# Patient Record
Sex: Male | Born: 1960 | Race: White | Hispanic: No | Marital: Single | Smoking: Current every day smoker
Health system: Southern US, Community
[De-identification: ages and names within clinical notes are randomized; demographics above are authoritative.]

## PROBLEM LIST (undated history)

## (undated) DIAGNOSIS — J45909 Unspecified asthma, uncomplicated: Secondary | ICD-10-CM

---

## 2017-01-06 LAB — CBG MONITORING, ED: Glucose-Capillary: 202 mg/dL — ABNORMAL HIGH (ref 65–99)

## 2017-01-07 ENCOUNTER — Observation Stay (HOSPITAL_COMMUNITY): Payer: Self-pay

## 2017-01-07 ENCOUNTER — Other Ambulatory Visit (HOSPITAL_COMMUNITY): Payer: Self-pay

## 2017-01-07 ENCOUNTER — Emergency Department (HOSPITAL_COMMUNITY): Payer: Self-pay

## 2017-01-07 ENCOUNTER — Encounter (HOSPITAL_COMMUNITY): Payer: Self-pay | Admitting: Radiology

## 2017-01-07 ENCOUNTER — Inpatient Hospital Stay (HOSPITAL_COMMUNITY)
Admission: EM | Admit: 2017-01-07 | Discharge: 2017-01-09 | DRG: 064 | Discharge status: Against Medical Advice | Payer: Self-pay | Attending: Internal Medicine | Admitting: Internal Medicine

## 2017-01-07 DIAGNOSIS — R930 Abnormal findings on diagnostic imaging of skull and head, not elsewhere classified: Secondary | ICD-10-CM

## 2017-01-07 DIAGNOSIS — D72829 Elevated white blood cell count, unspecified: Secondary | ICD-10-CM

## 2017-01-07 DIAGNOSIS — R748 Abnormal levels of other serum enzymes: Secondary | ICD-10-CM | POA: Diagnosis present

## 2017-01-07 DIAGNOSIS — K760 Fatty (change of) liver, not elsewhere classified: Secondary | ICD-10-CM | POA: Diagnosis present

## 2017-01-07 DIAGNOSIS — J45909 Unspecified asthma, uncomplicated: Secondary | ICD-10-CM | POA: Diagnosis present

## 2017-01-07 DIAGNOSIS — Z8673 Personal history of transient ischemic attack (TIA), and cerebral infarction without residual deficits: Secondary | ICD-10-CM | POA: Insufficient documentation

## 2017-01-07 DIAGNOSIS — E872 Acidosis: Secondary | ICD-10-CM | POA: Diagnosis present

## 2017-01-07 DIAGNOSIS — I6340 Cerebral infarction due to embolism of unspecified cerebral artery: Secondary | ICD-10-CM | POA: Insufficient documentation

## 2017-01-07 DIAGNOSIS — G92 Toxic encephalopathy: Secondary | ICD-10-CM | POA: Diagnosis present

## 2017-01-07 DIAGNOSIS — J189 Pneumonia, unspecified organism: Secondary | ICD-10-CM | POA: Diagnosis present

## 2017-01-07 DIAGNOSIS — I639 Cerebral infarction, unspecified: Principal | ICD-10-CM | POA: Diagnosis present

## 2017-01-07 DIAGNOSIS — N179 Acute kidney failure, unspecified: Secondary | ICD-10-CM | POA: Diagnosis present

## 2017-01-07 DIAGNOSIS — F17210 Nicotine dependence, cigarettes, uncomplicated: Secondary | ICD-10-CM | POA: Diagnosis present

## 2017-01-07 DIAGNOSIS — G9340 Encephalopathy, unspecified: Secondary | ICD-10-CM | POA: Diagnosis present

## 2017-01-07 DIAGNOSIS — I248 Other forms of acute ischemic heart disease: Secondary | ICD-10-CM | POA: Diagnosis present

## 2017-01-07 DIAGNOSIS — J181 Lobar pneumonia, unspecified organism: Secondary | ICD-10-CM

## 2017-01-07 DIAGNOSIS — T50901A Poisoning by unspecified drugs, medicaments and biological substances, accidental (unintentional), initial encounter: Secondary | ICD-10-CM | POA: Diagnosis present

## 2017-01-07 DIAGNOSIS — R4182 Altered mental status, unspecified: Secondary | ICD-10-CM

## 2017-01-07 DIAGNOSIS — J690 Pneumonitis due to inhalation of food and vomit: Secondary | ICD-10-CM | POA: Diagnosis present

## 2017-01-07 DIAGNOSIS — R7989 Other specified abnormal findings of blood chemistry: Secondary | ICD-10-CM | POA: Diagnosis present

## 2017-01-07 DIAGNOSIS — R4781 Slurred speech: Secondary | ICD-10-CM | POA: Diagnosis present

## 2017-01-07 HISTORY — DX: Unspecified asthma, uncomplicated: J45.909

## 2017-01-07 LAB — URINALYSIS, ROUTINE W REFLEX MICROSCOPIC
BACTERIA UA: NONE SEEN
BILIRUBIN URINE: NEGATIVE
Bilirubin Urine: NEGATIVE
GLUCOSE, UA: NEGATIVE mg/dL
Glucose, UA: 150 mg/dL — AB
KETONES UR: NEGATIVE mg/dL
KETONES UR: NEGATIVE mg/dL
LEUKOCYTES UA: NEGATIVE
Leukocytes, UA: NEGATIVE
Nitrite: NEGATIVE
Nitrite: NEGATIVE
PROTEIN: 100 mg/dL — AB
PROTEIN: NEGATIVE mg/dL
Specific Gravity, Urine: 1.008 (ref 1.005–1.030)
Specific Gravity, Urine: 1.012 (ref 1.005–1.030)
WBC, UA: NONE SEEN WBC/hpf (ref 0–5)
pH: 5 (ref 5.0–8.0)
pH: 6 (ref 5.0–8.0)

## 2017-01-07 LAB — I-STAT ARTERIAL BLOOD GAS, ED
ACID-BASE DEFICIT: 5 mmol/L — AB (ref 0.0–2.0)
ACID-BASE DEFICIT: 7 mmol/L — AB (ref 0.0–2.0)
Acid-base deficit: 4 mmol/L — ABNORMAL HIGH (ref 0.0–2.0)
BICARBONATE: 20.6 mmol/L (ref 20.0–28.0)
BICARBONATE: 21.8 mmol/L (ref 20.0–28.0)
BICARBONATE: 23.3 mmol/L (ref 20.0–28.0)
O2 SAT: 100 %
O2 SAT: 81 %
O2 Saturation: 87 %
PCO2 ART: 50.3 mmHg — AB (ref 32.0–48.0)
PO2 ART: 271 mmHg — AB (ref 83.0–108.0)
PO2 ART: 52 mmHg — AB (ref 83.0–108.0)
Patient temperature: 98.7
TCO2: 22 mmol/L (ref 0–100)
TCO2: 23 mmol/L (ref 0–100)
TCO2: 25 mmol/L (ref 0–100)
pCO2 arterial: 46.3 mmHg (ref 32.0–48.0)
pCO2 arterial: 47.8 mmHg (ref 32.0–48.0)
pH, Arterial: 7.257 — ABNORMAL LOW (ref 7.350–7.450)
pH, Arterial: 7.267 — ABNORMAL LOW (ref 7.350–7.450)
pH, Arterial: 7.274 — ABNORMAL LOW (ref 7.350–7.450)
pO2, Arterial: 60 mmHg — ABNORMAL LOW (ref 83.0–108.0)

## 2017-01-07 LAB — RAPID URINE DRUG SCREEN, HOSP PERFORMED
AMPHETAMINES: NOT DETECTED
BARBITURATES: NOT DETECTED
BENZODIAZEPINES: NOT DETECTED
COCAINE: NOT DETECTED
Opiates: NOT DETECTED
TETRAHYDROCANNABINOL: NOT DETECTED

## 2017-01-07 LAB — COMPREHENSIVE METABOLIC PANEL
ALBUMIN: 3.8 g/dL (ref 3.5–5.0)
ALK PHOS: 33 U/L — AB (ref 38–126)
ALT: 41 U/L (ref 17–63)
ALT: 49 U/L (ref 17–63)
ANION GAP: 15 (ref 5–15)
AST: 43 U/L — ABNORMAL HIGH (ref 15–41)
AST: 81 U/L — AB (ref 15–41)
Albumin: 4.4 g/dL (ref 3.5–5.0)
Alkaline Phosphatase: 42 U/L (ref 38–126)
Anion gap: 9 (ref 5–15)
BUN: 17 mg/dL (ref 6–20)
BUN: 18 mg/dL (ref 6–20)
CALCIUM: 8 mg/dL — AB (ref 8.9–10.3)
CALCIUM: 9.1 mg/dL (ref 8.9–10.3)
CHLORIDE: 99 mmol/L — AB (ref 101–111)
CO2: 21 mmol/L — AB (ref 22–32)
CO2: 22 mmol/L (ref 22–32)
Chloride: 106 mmol/L (ref 101–111)
Creatinine, Ser: 1.02 mg/dL (ref 0.61–1.24)
Creatinine, Ser: 1.48 mg/dL — ABNORMAL HIGH (ref 0.61–1.24)
GFR calc Af Amer: >60 mL/min (ref >60)
GFR calc non Af Amer: 52 mL/min — ABNORMAL LOW (ref >60)
GFR calc non Af Amer: >60 mL/min (ref >60)
GLUCOSE: 95 mg/dL (ref 65–99)
Glucose, Bld: 200 mg/dL — ABNORMAL HIGH (ref 65–99)
Potassium: 4.4 mmol/L (ref 3.5–5.1)
Potassium: 4.5 mmol/L (ref 3.5–5.1)
SODIUM: 135 mmol/L (ref 135–145)
SODIUM: 137 mmol/L (ref 135–145)
Total Bilirubin: 0.3 mg/dL (ref 0.3–1.2)
Total Bilirubin: 0.7 mg/dL (ref 0.3–1.2)
Total Protein: 6.6 g/dL (ref 6.5–8.1)
Total Protein: 7.7 g/dL (ref 6.5–8.1)

## 2017-01-07 LAB — CBG MONITORING, ED
Glucose-Capillary: 119 mg/dL — ABNORMAL HIGH (ref 65–99)
Glucose-Capillary: 100 mg/dL — ABNORMAL HIGH (ref 65–99)

## 2017-01-07 LAB — CBC WITH DIFFERENTIAL/PLATELET
BASOS PCT: 0 %
Basophils Absolute: 0 10*3/uL (ref 0.0–0.1)
Basophils Absolute: 0 10*3/uL (ref 0.0–0.1)
Basophils Relative: 0 %
EOS PCT: 0 %
Eosinophils Absolute: 0 10*3/uL (ref 0.0–0.7)
Eosinophils Absolute: 0 10*3/uL (ref 0.0–0.7)
Eosinophils Relative: 0 %
HCT: 43.2 % (ref 39.0–52.0)
HCT: 45.9 % (ref 39.0–52.0)
HEMOGLOBIN: 14.3 g/dL (ref 13.0–17.0)
HEMOGLOBIN: 15.3 g/dL (ref 13.0–17.0)
LYMPHS ABS: 0.9 10*3/uL (ref 0.7–4.0)
LYMPHS ABS: 1.1 10*3/uL (ref 0.7–4.0)
LYMPHS PCT: 7 %
Lymphocytes Relative: 4 %
MCH: 30 pg (ref 26.0–34.0)
MCH: 30.5 pg (ref 26.0–34.0)
MCHC: 33.1 g/dL (ref 30.0–36.0)
MCHC: 33.3 g/dL (ref 30.0–36.0)
MCV: 90.8 fL (ref 78.0–100.0)
MCV: 91.6 fL (ref 78.0–100.0)
MONOS PCT: 10 %
Monocytes Absolute: 0.5 10*3/uL (ref 0.1–1.0)
Monocytes Absolute: 2.3 10*3/uL — ABNORMAL HIGH (ref 0.1–1.0)
Monocytes Relative: 3 %
NEUTROS ABS: 19.7 10*3/uL — AB (ref 1.7–7.7)
Neutro Abs: 13.7 10*3/uL — ABNORMAL HIGH (ref 1.7–7.7)
Neutrophils Relative %: 86 %
Neutrophils Relative %: 90 %
PLATELETS: 333 10*3/uL (ref 150–400)
Platelets: 333 10*3/uL (ref 150–400)
RBC: 4.76 MIL/uL (ref 4.22–5.81)
RBC: 5.01 MIL/uL (ref 4.22–5.81)
RDW: 12.9 % (ref 11.5–15.5)
RDW: 13.2 % (ref 11.5–15.5)
WBC: 15.3 10*3/uL — AB (ref 4.0–10.5)
WBC: 23 10*3/uL — ABNORMAL HIGH (ref 4.0–10.5)

## 2017-01-07 LAB — BASIC METABOLIC PANEL
Anion gap: 11 (ref 5–15)
Anion gap: 9 (ref 5–15)
BUN: 18 mg/dL (ref 6–20)
BUN: 19 mg/dL (ref 6–20)
CALCIUM: 8.2 mg/dL — AB (ref 8.9–10.3)
CHLORIDE: 104 mmol/L (ref 101–111)
CO2: 22 mmol/L (ref 22–32)
CO2: 23 mmol/L (ref 22–32)
CREATININE: 1.36 mg/dL — AB (ref 0.61–1.24)
CREATININE: 1.19 mg/dL (ref 0.61–1.24)
Calcium: 8.1 mg/dL — ABNORMAL LOW (ref 8.9–10.3)
Chloride: 106 mmol/L (ref 101–111)
GFR calc Af Amer: >60 mL/min (ref >60)
GFR calc Af Amer: >60 mL/min (ref >60)
GFR calc non Af Amer: 58 mL/min — ABNORMAL LOW (ref >60)
GFR calc non Af Amer: >60 mL/min (ref >60)
GLUCOSE: 110 mg/dL — AB (ref 65–99)
GLUCOSE: 115 mg/dL — AB (ref 65–99)
POTASSIUM: 4.9 mmol/L (ref 3.5–5.1)
Potassium: 4.9 mmol/L (ref 3.5–5.1)
SODIUM: 138 mmol/L (ref 135–145)
Sodium: 137 mmol/L (ref 135–145)

## 2017-01-07 LAB — I-STAT CHEM 8, ED
BUN: 20 mg/dL (ref 6–20)
CALCIUM ION: 1.11 mmol/L — AB (ref 1.15–1.40)
Chloride: 101 mmol/L (ref 101–111)
Creatinine, Ser: 1.6 mg/dL — ABNORMAL HIGH (ref 0.61–1.24)
GLUCOSE: 202 mg/dL — AB (ref 65–99)
HCT: 47 % (ref 39.0–52.0)
Hemoglobin: 16 g/dL (ref 13.0–17.0)
Potassium: 4.5 mmol/L (ref 3.5–5.1)
Sodium: 136 mmol/L (ref 135–145)
TCO2: 26 mmol/L (ref 0–100)

## 2017-01-07 LAB — HEPATIC FUNCTION PANEL
ALBUMIN: 4.2 g/dL (ref 3.5–5.0)
ALK PHOS: 35 U/L — AB (ref 38–126)
ALT: 47 U/L (ref 17–63)
AST: 57 U/L — AB (ref 15–41)
BILIRUBIN TOTAL: 1 mg/dL (ref 0.3–1.2)
Bilirubin, Direct: 0.3 mg/dL (ref 0.1–0.5)
Indirect Bilirubin: 0.7 mg/dL (ref 0.3–0.9)
Total Protein: 7.2 g/dL (ref 6.5–8.1)

## 2017-01-07 LAB — SODIUM, URINE, RANDOM: Sodium, Ur: 124 mmol/L

## 2017-01-07 LAB — BRAIN NATRIURETIC PEPTIDE: B NATRIURETIC PEPTIDE 5: 69.7 pg/mL (ref 0.0–100.0)

## 2017-01-07 LAB — CREATININE, URINE, RANDOM: CREATININE, URINE: 62.33 mg/dL

## 2017-01-07 LAB — MISCELLANEOUS TEST

## 2017-01-07 LAB — ETHANOL: Alcohol, Ethyl (B): <5 mg/dL (ref <5)

## 2017-01-07 LAB — TROPONIN I
TROPONIN I: 0.29 ng/mL — AB (ref <0.03)
TROPONIN I: 0.43 ng/mL — AB (ref <0.03)
Troponin I: 0.3 ng/mL (ref <0.03)
Troponin I: 0.34 ng/mL (ref <0.03)

## 2017-01-07 LAB — HIV ANTIBODY (ROUTINE TESTING W REFLEX): HIV Screen 4th Generation wRfx: NONREACTIVE

## 2017-01-07 LAB — I-STAT CG4 LACTIC ACID, ED: LACTIC ACID, VENOUS: 1.65 mmol/L (ref 0.5–1.9)

## 2017-01-07 LAB — SALICYLATE LEVEL: Salicylate Lvl: <7 mg/dL (ref 2.8–30.0)

## 2017-01-07 LAB — AMMONIA: Ammonia: 45 umol/L — ABNORMAL HIGH (ref 9–35)

## 2017-01-07 LAB — CK: Total CK: 546 U/L — ABNORMAL HIGH (ref 49–397)

## 2017-01-07 LAB — STREP PNEUMONIAE URINARY ANTIGEN: Strep Pneumo Urinary Antigen: NEGATIVE

## 2017-01-07 LAB — ACETAMINOPHEN LEVEL

## 2017-01-07 MED ORDER — ASPIRIN 81 MG PO CHEW
81.0000 mg | CHEWABLE_TABLET | Freq: Every day | ORAL | Status: DC
  Administered 2017-01-08: 81 mg via ORAL
  Filled 2017-01-07: qty 1

## 2017-01-07 MED ORDER — NALOXONE HCL 0.4 MG/ML IJ SOLN
0.4000 mg | Freq: Once | INTRAMUSCULAR | Status: AC
  Administered 2017-01-07: 0.4 mg via INTRAVENOUS
  Filled 2017-01-07: qty 1

## 2017-01-07 MED ORDER — DEXTROSE 5 % IV SOLN
1.0000 g | INTRAVENOUS | Status: DC
  Administered 2017-01-07 – 2017-01-09 (×3): 1 g via INTRAVENOUS
  Filled 2017-01-07 (×3): qty 10

## 2017-01-07 MED ORDER — ONDANSETRON HCL 4 MG/2ML IJ SOLN: 4.0000 mg | Freq: Four times a day (QID) | INTRAMUSCULAR | Status: DC | PRN

## 2017-01-07 MED ORDER — VANCOMYCIN HCL IN DEXTROSE 1-5 GM/200ML-% IV SOLN
1000.0000 mg | Freq: Once | INTRAVENOUS | Status: AC
  Administered 2017-01-07: 1000 mg via INTRAVENOUS
  Filled 2017-01-07: qty 200

## 2017-01-07 MED ORDER — PIPERACILLIN-TAZOBACTAM 3.375 G IVPB
3.3750 g | Freq: Once | INTRAVENOUS | Status: AC
  Administered 2017-01-07: 3.375 g via INTRAVENOUS
  Filled 2017-01-07: qty 50

## 2017-01-07 MED ORDER — LORAZEPAM 2 MG/ML IJ SOLN
1.0000 mg | Freq: Once | INTRAMUSCULAR | Status: AC
  Administered 2017-01-07: 1 mg via INTRAVENOUS
  Filled 2017-01-07: qty 1

## 2017-01-07 MED ORDER — SODIUM CHLORIDE 0.9 % IV BOLUS (SEPSIS)
1000.0000 mL | Freq: Once | INTRAVENOUS | Status: AC
Start: 2017-01-07 — End: 2017-01-07
  Administered 2017-01-07: 1000 mL via INTRAVENOUS

## 2017-01-07 MED ORDER — ACETAMINOPHEN 325 MG PO TABS: 650.0000 mg | ORAL_TABLET | Freq: Four times a day (QID) | ORAL | Status: DC | PRN

## 2017-01-07 MED ORDER — DEXTROSE 5 % IV SOLN
500.0000 mg | INTRAVENOUS | Status: DC
  Administered 2017-01-07 – 2017-01-09 (×3): 500 mg via INTRAVENOUS
  Filled 2017-01-07 (×3): qty 500

## 2017-01-07 MED ORDER — ALBUTEROL SULFATE (2.5 MG/3ML) 0.083% IN NEBU
2.5000 mg | INHALATION_SOLUTION | RESPIRATORY_TRACT | Status: DC | PRN
  Administered 2017-01-07: 2.5 mg via RESPIRATORY_TRACT
  Filled 2017-01-07: qty 3

## 2017-01-07 MED ORDER — ACETAMINOPHEN 650 MG RE SUPP: 650.0000 mg | Freq: Four times a day (QID) | RECTAL | Status: DC | PRN

## 2017-01-07 MED ORDER — SODIUM CHLORIDE 0.9 % IV SOLN
INTRAVENOUS | Status: AC
  Administered 2017-01-07: 08:00:00 via INTRAVENOUS

## 2017-01-07 MED ORDER — HEPARIN SODIUM (PORCINE) 5000 UNIT/ML IJ SOLN
5000.0000 [IU] | Freq: Three times a day (TID) | INTRAMUSCULAR | Status: DC
  Administered 2017-01-07 – 2017-01-09 (×4): 5000 [IU] via SUBCUTANEOUS
  Filled 2017-01-07 (×4): qty 1

## 2017-01-07 MED ORDER — SODIUM CHLORIDE 0.9 % IV BOLUS (SEPSIS)
1000.0000 mL | Freq: Once | INTRAVENOUS | Status: AC
  Administered 2017-01-07: 1000 mL via INTRAVENOUS

## 2017-01-07 MED ORDER — LACTULOSE 10 GM/15ML PO SOLN
10.0000 g | Freq: Three times a day (TID) | ORAL | Status: DC
  Administered 2017-01-07 – 2017-01-08 (×3): 10 g via ORAL
  Filled 2017-01-07 (×7): qty 15

## 2017-01-07 MED ORDER — ONDANSETRON HCL 4 MG PO TABS: 4.0000 mg | ORAL_TABLET | Freq: Four times a day (QID) | ORAL | Status: DC | PRN

## 2017-01-07 MED ORDER — SODIUM CHLORIDE 0.9 % IV SOLN
INTRAVENOUS | Status: DC
  Administered 2017-01-07: 03:00:00 via INTRAVENOUS

## 2017-01-07 NOTE — ED Notes (Signed)
Singh, MD at bedside.

## 2017-01-07 NOTE — ED Notes (Signed)
[facility] called and states they are unfamiliar with the panel needed

## 2017-01-07 NOTE — ED Notes (Signed)
Main lab called again with lab code and name. Main lab to help order the lab

## 2017-01-07 NOTE — Procedures (Signed)
ELECTROENCEPHALOGRAM REPORT  Date of Study: 01/07/2017  Patient's Name: Darren Lane MRN: [id number] Date of Birth: [birth year]  Referring Provider: Dr. Midge MiniumArshad Kakrakandy  Clinical History: This is a 56 year old man with altered mental status.   Medications: acetaminophen (TYLENOL) tablet 650 mg  aspirin chewable tablet 81 mg  azithromycin (ZITHROMAX) 500 mg in dextrose 5 % 250 mL IVPB  cefTRIAXone (ROCEPHIN) 1 g in dextrose 5 % 50 mL IVPB   Technical Summary: A multichannel digital EEG recording measured by the international 10-20 system with electrodes applied with paste and impedances below 5000 ohms performed as portable with EKG monitoring in a predominantly drowsy and asleep patient.  Hyperventilation and photic stimulation were not performed.  The digital EEG was referentially recorded, reformatted, and digitally filtered in a variety of bipolar and referential montages for optimal display.   Description: The patient is predominantly drowsy and asleep during the recording.  During brief period of wakefulness, there is a symmetric, medium voltage 8 Hz posterior dominant rhythm that poorly attenuates with eye opening and eye closure. This is admixed with a small amount of diffuse 4-5 Hz theta and 2-3 Hz delta slowing of the waking background.  During drowsiness and sleep, there is an increase in theta and delta slowing of the background, at times with triphasic-like appearance. Occasional vertex waves were seen. Hyperventilation and photic stimulation were not performed.  There were no epileptiform discharges or electrographic seizures seen.    EKG lead showed sinus tachycardia.  Impression: This predominantly drowsy and asleep EEG is abnormal due to mild to moderate diffuse slowing of the background, at times with triphasic-like appearance.  Clinical Correlation of the above findings indicates diffuse cerebral dysfunction that is non-specific in etiology and can be seen with  hypoxic/ischemic injury, toxic/metabolic encephalopathies, or medication effect. Triphasic-like waves are typically seen with hepatic encephalopathy, but can be seen with other metabolic encephalopathies as well. The absence of epileptiform discharges does not rule out a clinical diagnosis of epilepsy.  Clinical correlation is advised.   Patrcia DollyKaren Aquino, M.D.

## 2017-01-07 NOTE — Consult Note (Signed)
Requesting Physician: Dr. Thedore Mins    Chief Complaint: confusion with punctate CVA found on MRI  History obtained from:  Darren Lane     HPI:                                                                                                                                         Darren Lane is an 56 y.o. male history of asthma was brought to the ER after Darren Lane was found to be unresponsive by Darren Lane's friend last night around 11 PM. As per the Darren Lane's friend Darren Lane was doing fine when she left home at around 3 PM. When she came back home Darren Lane was lying on the bed unresponsive with labored breathing. EMS was called and Darren Lane was given Narcan following which Darren Lane's breathing improved. In the ER Darren Lane was found to have pinpoint pupils. CT head was done which was showing abnormality concerning for stroke and neurologist on call as we initially consulted. Darren Lane was given another dose of Narcan in the ER and became alert and awake. Lab work show leukocytosis and anion gap metabolic acidosis and renal failure. Darren Lane states did use antifreeze yesterday to fill the car but denies drinking any antifreeze. Darren Lane drinks alcohol only very occasionally. Denies using drugs. Urine drug screen came negative. Blood alcohol levels including methanol and ethylene glycol levels have been sent. On exam Darren Lane is alert awake oriented to time place and person but occasionally dozes off.  During neurological consultation Darren Lane states that he did take Xanax however this was not found in his drug screen. During the consultation Darren Lane with commonly not often fall asleep but then quickly wake up when stimulated. Darren Lane states that he's been under a lot of stress lately and has not been sleeping. Discussing with the wife was at bedside he does get approximate 7-8 hours sleep at night. Darren Lane is very lethargic and has slurred speech. He is perseverating on being stressed out and working 2 jobs. Darren Lane appears flushed  at this point in time but is able to follow commands  ED Course: Chest Xray shows possible infiltrates and pleural effusion. Blood work was showing leukocytosis and anion gap metabolic acidosis with renal failure. CT head was showing abnormalities concerning for possible stroke. Blood methanol level, ethylene glycol and isopropyl alcohol levels have been sent. Troponin came mildly elevated.  Date last known well: Unable to determine Time last known well: Unable to determine tPA Given: No: minimal symptoms   Past Medical History:  Diagnosis Date  . Asthma     History reviewed. No pertinent surgical history.  Family History  Problem Relation Age of Onset  . Asthma Neg Hx   . Diabetes Mellitus II Neg Hx    Social History:  reports that he has never smoked. He has never used smokeless tobacco. He reports that he drinks alcohol. His drug history is not on file.  Allergies:  No Known Allergies  Medications:                                                                                                                          Current Facility-Administered Medications  Medication Dose Route Frequency Provider Last Rate Last Dose  . 0.9 %  sodium chloride infusion   Intravenous Continuous Eduard ClosArshad N Kakrakandy, MD 125 mL/hr at 01/07/17 0753    . acetaminophen (TYLENOL) tablet 650 mg  650 mg Oral Q6H PRN Eduard ClosArshad N Kakrakandy, MD       Or  . acetaminophen (TYLENOL) suppository 650 mg  650 mg Rectal Q6H PRN Eduard ClosArshad N Kakrakandy, MD      . aspirin chewable tablet 81 mg  81 mg Oral Daily Leroy SeaPrashant K Singh, MD      . azithromycin (ZITHROMAX) 500 mg in dextrose 5 % 250 mL IVPB  500 mg Intravenous Q24H Eduard ClosArshad N Kakrakandy, MD 250 mL/hr at 01/07/17 0857 500 mg at 01/07/17 0857  . cefTRIAXone (ROCEPHIN) 1 g in dextrose 5 % 50 mL IVPB  1 g Intravenous Q24H Stevphen RochesterJames L Ledford, RPH   Stopped at 01/07/17 [id number]  . ondansetron (ZOFRAN) tablet 4 mg  4 mg Oral Q6H PRN Eduard ClosArshad N Kakrakandy, MD       Or  .  ondansetron [facility](ZOFRAN) injection 4 mg  4 mg Intravenous Q6H PRN Eduard ClosArshad N Kakrakandy, MD       No current outpatient prescriptions on file.     ROS:                                                                                                                                       History obtained from the Darren Lane  General ROS: negative for - chills, fatigue, fever, night sweats, weight gain or weight loss Psychological ROS: negative for - behavioral disorder, hallucinations, memory difficulties, mood swings or suicidal ideation Ophthalmic ROS: negative for - blurry vision, double vision, eye pain or loss of vision ENT ROS: negative for - epistaxis, nasal discharge, oral lesions, sore throat, tinnitus or vertigo Allergy and Immunology ROS: negative for - hives or itchy/watery eyes Hematological and Lymphatic ROS: negative for - bleeding problems, bruising or swollen lymph nodes Endocrine ROS: negative for - galactorrhea, hair pattern changes, polydipsia/polyuria or temperature intolerance Respiratory ROS: negative for - cough, hemoptysis, shortness of breath or wheezing Cardiovascular ROS: negative for -  chest pain, dyspnea on exertion, edema or irregular heartbeat Gastrointestinal ROS: negative for - abdominal pain, diarrhea, hematemesis, nausea/vomiting or stool incontinence Genito-Urinary ROS: negative for - dysuria, hematuria, incontinence or urinary frequency/urgency Musculoskeletal ROS: negative for - joint swelling or muscular weakness Neurological ROS: as noted in HPI Dermatological ROS: negative for rash and skin lesion changes  Neurologic Examination:                                                                                                      Blood pressure 116/76, pulse 120, temperature 99.9 F (37.7 C), temperature source Rectal, resp. rate 10, SpO2 98 %.  HEENT-  Normocephalic, no lesions, without obvious abnormality.  Normal external eye and conjunctiva.  Normal TM's  bilaterally.  Normal auditory canals and external ears. Normal external nose, mucus membranes and septum.  Normal pharynx. Cardiovascular- S1, S2 normal, pulses palpable throughout   Lungs- chest clear, no wheezing, rales, normal symmetric air entry Abdomen- normal findings: bowel sounds normal Extremities- no edema Lymph-no adenopathy palpable Musculoskeletal-no joint tenderness, deformity or swelling Skin-warm and dry, no hyperpigmentation, vitiligo, or suspicious lesions  Neurological Examination Mental Status: Darren Lane is very drowsy and will continually follow sleep is not continuously stimulated either verbally or by tactile stimuli. Eyes will slowly closed and head will fall backwards. Oriented-but is not a very good historian.  Speech dysarthric without evidence of aphasia.  Able to follow 3 step commands without difficulty. Darren Lane became slightly angry when told that he was going to stay at the hospital, as we have no good etiology to understand why he is so drowsy and continues to fall asleep. Cranial Nerves: II: ; Visual fields grossly normal,  III,IV, VI: ptosis not present, extra-ocular motions intact bilaterally, pupils equal 1 mm equally, round, reactive to light and accommodation V,VII: smile symmetric, facial light touch sensation normal bilaterally VIII: hearing normal bilaterally IX,X: uvula rises symmetrically XI: bilateral shoulder shrug XII: midline tongue extension Motor: Right : Upper extremity   5/5    Left:     Upper extremity   5/5  Lower extremity   5/5     Lower extremity   5/5 Tone and bulk:normal tone throughout; no atrophy noted Sensory: Pinprick and light touch intact throughout, bilaterally Deep Tendon Reflexes: 2+ and symmetric throughout Plantars: Right: downgoing   Left: downgoing Cerebellar: Darren Lane had notable tremor all doing finger-to-nose but no dysmetria. Darren Lane was able to do heel-to-shin without any difficulty. Gait: Not tested secondary to  Darren Lane safety as he continuously would fall asleep.       Lab Results: Basic Metabolic Panel:  Recent Labs Lab 01/07/17 0004 01/07/17 0013 01/07/17 0556 01/07/17 0804  NA 135 136 137 138  K 4.5 4.5 4.9 4.9  CL 99* 101 104 106  CO2 21*  --  22 23  GLUCOSE 200* 202* 110* 115*  BUN 17 20 18 19   CREATININE 1.48* 1.60* 1.36* 1.19  CALCIUM 9.1  --  8.1* 8.2*    Liver Function Tests:  Recent Labs Lab 01/07/17 0004 01/07/17 0556  AST 43* 57*  ALT 41 47  ALKPHOS 42 35*  BILITOT 0.3 1.0  PROT 7.7 7.2  ALBUMIN 4.4 4.2   No results for input(s): LIPASE, AMYLASE in the last 168 hours.  Recent Labs Lab 01/07/17 0547  AMMONIA 45*    CBC:  Recent Labs Lab 01/07/17 0004 01/07/17 0013 01/07/17 0556  WBC 23.0*  --  15.3*  NEUTROABS 19.7*  --  13.7*  HGB 15.3 16.0 14.3  HCT 45.9 47.0 43.2  MCV 91.6  --  90.8  PLT 333  --  333    Cardiac Enzymes:  Recent Labs Lab 01/07/17 0149 01/07/17 0804  CKTOTAL 546*  --   TROPONINI 0.29* 0.43*    Lipid Panel: No results for input(s): CHOL, TRIG, HDL, CHOLHDL, VLDL, LDLCALC in the last 168 hours.  CBG:  Recent Labs Lab 01/07/17 0003 01/07/17 0739  GLUCAP 202* 119*    Microbiology: No results found for this or any previous visit.  Coagulation Studies: No results for input(s): LABPROT, INR in the last 72 hours.  Imaging: Ct Head Wo Contrast  Result Date: 01/07/2017 CLINICAL DATA:  56 y/o M; altered mental status following possible overdose. All EXAM: CT HEAD WITHOUT CONTRAST CT CERVICAL SPINE WITHOUT CONTRAST TECHNIQUE: Multidetector CT imaging of the head and cervical spine was performed following the standard protocol without intravenous contrast. Multiplanar CT image reconstructions of the cervical spine were also generated. COMPARISON:  None. FINDINGS: CT HEAD FINDINGS Brain: No evidence of acute infarction, hemorrhage, hydrocephalus, extra-axial collection or mass lesion/mass effect. Small lucencies in  left lentiform nucleus at insula may represent prominent perivascular spaces or age-indeterminate lacunar infarcts. Vascular: No hyperdense vessel or unexpected calcification. Skull: Normal. Negative for fracture or focal lesion. Sinuses/Orbits: Partial opacification of paranasal sinuses with aerosolized secretions and fluid levels. Normally pneumatized mastoid air cells. Orbits are unremarkable. Other: Left-sided nasal airway to urinating in the hypopharynx. CT CERVICAL SPINE FINDINGS Alignment: Straightening of cervical lordosis without listhesis or dislocation. Skull base and vertebrae: No acute fracture. No primary bone lesion or focal pathologic process. Soft tissues and spinal canal: No prevertebral fluid or swelling. No visible canal hematoma. Disc levels: Mild cervical spondylosis with minimal disc space narrowing and marginal osteophytosis at the C5 through C7 levels. Right-sided upper cervical facet degenerative changes. No high-grade bony canal stenosis. Upper chest: Emphysema of lung apices. Other: Debris within the oral and hypopharynx. IMPRESSION: 1. No acute intracranial abnormality identified. 2. Small lucencies in left lentiform nucleus and insula may represent prominent perivascular spaces or age-indeterminate lacunar infarcts. 3. Opacification of paranasal sinuses with fluid levels possibly due to the presence of nasal tube. 4. No acute fracture or dislocation of cervical spine. 5. Lung apex emphysema. Electronically Signed   By: Mitzi Hansen M.D.   On: 01/07/2017 01:14   Ct Cervical Spine Wo Contrast  Result Date: 01/07/2017 CLINICAL DATA:  56 y/o M; altered mental status following possible overdose. All EXAM: CT HEAD WITHOUT CONTRAST CT CERVICAL SPINE WITHOUT CONTRAST TECHNIQUE: Multidetector CT imaging of the head and cervical spine was performed following the standard protocol without intravenous contrast. Multiplanar CT image reconstructions of the cervical spine were also  generated. COMPARISON:  None. FINDINGS: CT HEAD FINDINGS Brain: No evidence of acute infarction, hemorrhage, hydrocephalus, extra-axial collection or mass lesion/mass effect. Small lucencies in left lentiform nucleus at insula may represent prominent perivascular spaces or age-indeterminate lacunar infarcts. Vascular: No hyperdense vessel or unexpected calcification. Skull: Normal. Negative for fracture or focal lesion. Sinuses/Orbits: Partial opacification of paranasal sinuses  with aerosolized secretions and fluid levels. Normally pneumatized mastoid air cells. Orbits are unremarkable. Other: Left-sided nasal airway to urinating in the hypopharynx. CT CERVICAL SPINE FINDINGS Alignment: Straightening of cervical lordosis without listhesis or dislocation. Skull base and vertebrae: No acute fracture. No primary bone lesion or focal pathologic process. Soft tissues and spinal canal: No prevertebral fluid or swelling. No visible canal hematoma. Disc levels: Mild cervical spondylosis with minimal disc space narrowing and marginal osteophytosis at the C5 through C7 levels. Right-sided upper cervical facet degenerative changes. No high-grade bony canal stenosis. Upper chest: Emphysema of lung apices. Other: Debris within the oral and hypopharynx. IMPRESSION: 1. No acute intracranial abnormality identified. 2. Small lucencies in left lentiform nucleus and insula may represent prominent perivascular spaces or age-indeterminate lacunar infarcts. 3. Opacification of paranasal sinuses with fluid levels possibly due to the presence of nasal tube. 4. No acute fracture or dislocation of cervical spine. 5. Lung apex emphysema. Electronically Signed   By: Mitzi Hansen M.D.   On: 01/07/2017 01:14   Mr Angiogram Head Wo Contrast  Result Date: 01/07/2017 CLINICAL DATA:  56 year old male found unresponsive. Left cerebellar infarct. Subsequent encounter. EXAM: MRA HEAD WITHOUT CONTRAST TECHNIQUE: Angiographic images of the  Circle of Willis were obtained using MRA technique without intravenous contrast. COMPARISON:  01/07/2017 brain MR and head CT. FINDINGS: Exam is motion degraded. This limits evaluation for detection of an aneurysm or adequately grading stenosis. Anterior circulation without large size vessel significant stenosis or occlusion. Mild ectasia distal cervical segment left internal carotid artery. Fetal contribution to posterior cerebral artery with mild prominence origin of the left posterior communicating artery. Middle cerebral artery branch vessel irregularity and narrowing bilaterally, more notable on the left. No significant stenosis of the distal vertebral arteries or basilar artery. Small left posterior inferior cerebellar artery. Moderately large left anterior inferior cerebellar artery with areas of mild narrowing. Poor delineation of the right posterior inferior cerebellar artery. Moderately large right anterior inferior cerebellar artery. Mild to moderate narrowing mid to distal aspect left superior cerebellar artery. IMPRESSION: Exam is motion degraded. No significant stenosis distal vertebral arteries or basilar artery. Mild to moderate narrowing mid to distal aspect left superior cerebellar artery. Poor delineation of the right posterior inferior cerebellar artery. Mild narrowing of portions of the left anterior inferior cerebellar artery. Anterior circulation without large size vessel significant stenosis or occlusion. Narrowing and irregularity of middle cerebral artery branches greater on the left. Fetal contribution to posterior cerebral artery bilaterally. Electronically Signed   By: Lacy Duverney M.D.   On: 01/07/2017 08:20   Mr Brain Wo Contrast  Result Date: 01/07/2017 CLINICAL DATA:  Found unresponsive in bit head.  Labored breathing. EXAM: MRI HEAD WITHOUT CONTRAST TECHNIQUE: Multiplanar, multiecho pulse sequences of the brain and surrounding structures were obtained without intravenous  contrast. COMPARISON:  CT same day FINDINGS: Brain: Diffusion imaging shows a 5 mm acute infarction within the left cerebellum. No other acute infarction. Brainstem and cerebellum are otherwise normal. Cerebral hemispheres are normal. No mass lesion, hemorrhage, hydrocephalus or extra-axial collection. No pituitary mass. Vascular: Major vessels at the base of the brain show flow. Skull and upper cervical spine: Negative Sinuses/Orbits: Inflammatory changes throughout the paranasal sinuses. Mastoids are clear. Orbits are negative. 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 The study is otherwise normal except for a inflammatory changes of the paranasal sinuses. Electronically Signed   By: Paulina Fusi M.D.   On: 01/07/2017 07:17  Dg Chest Port 1 View  Result Date: 01/07/2017 CLINICAL DATA:  56 y/o  M; found unresponsive.  History of drug use. EXAM: PORTABLE CHEST 1 VIEW COMPARISON:  None. FINDINGS: Asymmetric hazy opacification of the left lung may represent edema or layering pleural effusion. Low lung volumes. Cardiac silhouette with no normal limits given projection and technique. Bones are unremarkable. IMPRESSION: Asymmetric hazy opacification of left lung may represent edema or layering pleural effusion. Electronically Signed   By: Mitzi Hansen M.D.   On: 01/07/2017 00:32       Assessment and plan discussed with with attending physician and they are in agreement.    Felicie Morn PA-C [facility] [phone]  01/07/2017, 9:56 AM   Assessment: 56 y.o. male presented emergency department with altered mental status of unknown etiology at this time. Thus far urine drug screen is negative. Currently testing for other sources. On exam Darren Lane is extremely drowsy and a poor historian. There is question if Darren Lane may have ingested some other form of medication or drug of abuse that is not found on our drug panel. Incidentally a 5 mm  infarct was found on MRI. At this time I do not believe that this is the cause of his issues and most likely incidental.  Stroke Risk Factors - none  Recommend: 1. HgbA1c, fasting lipid panel 2. MRI, MRA  of the brain without contrast 3. PT consult, OT consult, Speech consult 4. Echocardiogram 5. Carotid dopplers 6. Prophylactic therapy-Antiplatelet med: Aspirin - dose 81 mg daily 7. Risk factor modification 8. Telemetry monitoring 9. Frequent neuro checks 10 NPO until passes stroke swallow screen 11 please page stroke NP  Or  PA  Or MD from 8am -4 pm  as this Darren Lane from this time will be  followed by the stroke.   You can look them up on [email or web address]  Password [email or web address]

## 2017-01-07 NOTE — Progress Notes (Signed)
Pharmacy Antibiotic Note  Darren Lane is a 56 y.o. male admitted on 01/07/2017 with pneumonia.  Pharmacy has been consulted for Ceftiaxone dosing. WBC elevated.   Plan: -Ceftriaxone 1g IV q24h -Azithromycin per MD -Trend WBC, temp -F/U infectious work-up  Temp (24hrs), Avg:99.9 F (37.7 C), Min:99.9 F (37.7 C), Max:99.9 F (37.7 C)   Recent Labs Lab 01/07/17 0004 01/07/17 0013 01/07/17 0158  WBC 23.0*  --   --   CREATININE 1.48* 1.60*  --   LATICACIDVEN  --   --  1.65    CrCl cannot be calculated (Unknown ideal weight.).    No Known Allergies   Abran DukeLedford, [place] 01/07/2017 5:35 AM

## 2017-01-07 NOTE — ED Notes (Signed)
Spoke with Darren Lane with Poison Control regarding patient's status and updates. Patient A&O x 4, denies weakness/numbness or other complaints at this time. Wife remains at bedside. VS stable.

## 2017-01-07 NOTE — ED Triage Notes (Signed)
[facility] for possible overdose. 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 Received 2 of narcan intranasally and 0.5 narcan Iv. Pt was sating in the 80s upon arrival and 99% on BVM.

## 2017-01-07 NOTE — ED Notes (Signed)
Pt having EEG completed at this time

## 2017-01-07 NOTE — ED Notes (Signed)
Went to round on pt and found him with his [place] off and spo2 in mid 80's.  Placed pt back on [place].  Pt appears sleepy but is arousable and oriented x3. Pt asks me about getting up and states that he has been wanting to get up for days but then falls back asleep.  Swab moistened for his mouth.  IV WNL.  Will continue to monitor

## 2017-01-07 NOTE — ED Notes (Signed)
[facility] Dr[facility] Elesa MassedWard needs can go in red tube

## 2017-01-07 NOTE — ED Provider Notes (Addendum)
TIME SEEN: 11:50 PM  CHIEF COMPLAINT: Altered mental status, possible overdose  HPI: Pt is a 56 y.o. 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 EMS reports that friend told them he intermittently snorts heroin. He was given 2 mg of intranasal Narcan and 0.5 mg of IV Narcan with some improvement. Respirations in the 30s. Will move all extremities and open eyes and groan but does not answer questions or follow commands. Sats in the 80s on room air with EMS. Blood glucose 333.  Unclear last normal.  ROS: Level V caveat for altered mental status   PCP - none   PAST MEDICAL HISTORY/PAST SURGICAL HISTORY:  No past medical history on file.  MEDICATIONS:  Prior to Admission medications   Not on File    ALLERGIES:  Allergies not on file  SOCIAL HISTORY:  Social History  Substance Use Topics  . Smoking status: Not on file  . Smokeless tobacco: Not on file  . Alcohol use Not on file    FAMILY HISTORY: No family history on file.  EXAM: BP 105/75   Pulse 106   Temp 99.9 F (37.7 C) (Rectal)   Resp 16   SpO2 100%  CONSTITUTIONAL: Alert; Chronically ill-appearing, will open his eyes spontaneously and move all extremities but does not follow commands or answer questions, moans intermittently; GCS 11 HEAD: Normocephalic, atraumatic EYES: Conjunctivae clear, pinpoint pupils, EOMI ENT: normal nose; no rhinorrhea; moist mucous membranes NECK: Supple, no meningismus, no nuchal rigidity, no LAD; no midline spinal tenderness or step-off or deformity CARD: RRR; S1 and S2 appreciated; no murmurs, no clicks, no rubs, no gallops RESP: Normal chest excursion without splinting or tachypnea; some crackles intermittently but no wheezes or rhonchi, on nonrebreather, spontaneous respirations ABD/GI: Normal bowel sounds; non-distended; soft, non-tender, no rebound, no guarding, no peritoneal signs, no  hepatosplenomegaly BACK:  The back appears normal and is non-tender to palpation, there is no CVA tenderness, no midline spinal tenderness or step-off or deformity EXT: Normal ROM in all joints; non-tender to palpation; no edema; normal capillary refill; no cyanosis, no calf tenderness or swelling    SKIN: Normal color for age and race; warm; no rash NEURO: Moves all extremities equally, opens his eyes spontaneously, groans intermittently, GCS 11   MEDICAL DECISION MAKING: Patient here after possible overdose. No signs of trauma on exam. Will check labs, CT head and cervical spine, chest x-ray, urine. Will obtain rectal temperature. We'll give IV fluids. Repeat blood glucose here is normal. ABG shows metabolic acidosis.  At this time he is protecting his airway. I do not feel he needs Narcan right now or intubation. We will monitor him closely.  No obvious focal neurologic deficit on exam.  ED PROGRESS: Patient becoming increasingly combative. We'll have to give him IV Ativan to that we can further evaluate him.   1:50 AM  Patient's labs show leukocytosis with left shift which may be reactive versus due to infection. Rectal temperature is 99.9. Does appear to have a layering effusion on the left side of his chest x-ray. Mildly tachycardic with slightly low blood pressure. Will continue IV fluids and give broad-spectrum antibiotics for possible sepsis. Creatinine mildly elevated. Alcohol, salicylate, Tylenol levels negative. Urine drug screen negative. Urine shows blood but no other sign of infection. We'll send urine cultures, obtain blood cultures and lactate. Patient is sleeping, protecting his airway, still on nonrebreather. People still pinpoint.  Patient's sister and girlfriend now at bedside. Patient's girlfriend p.m. reports he was last seen normal at 2:45 PM on 01/06/17 before she went to work. She found him in the bed unresponsive. Tried to throw a glass of water on him and he was unresponsive. She  called 911. She states that he has had a previous history of heroin use but this was in his 27s. She does not think that he has been using drugs. She states that she knows that he had one beer today. No recent infectious symptoms, fall, head injury. No fevers, cough, vomiting or diarrhea recently. States he has a history of asthma and uses an inhaler but is not on any other medications. Does not have a PCP. No history of depression or previous suicide attempt. No recent suicidal ideation.  CT head shows small lucencies in the left lentiform nucleus and insular that may represent prominent perivascular spaces versus age indeterminant lacunar infarcts. CT of the cervical spine shows no acute abnormality.  Patient would not be a TPA candidate because last seen normal 2:45 PM yesterday. We will consult neuro hospitalist on call for recommendations given head CT abnormalities.  GF also reports he told her he was going to change the antifreeze in the cars today.  Discussed with poison control who recommended sending toxic alcohol panel to [facility]. Family is not concerned that he has had any ingestion today. Does have mild metabolic acidosis though.   2:30 AM  D/w Dr. Otelia Limes with neuro who will see the patient in consult. Recommends obtaining MRI of the brain without contrast.  He has reviewed patient's CT images and feels that this is a soft call for lacunar infarct.   2:45 AM  Pt received 0.4 mg of IV Narcan and now awake, oriented 3, no focal neurologic deficits. Updated neurology. Will cancel MRI of his brain. Troponin is positive which is likely secondary to demand ischemia. Discussed with Dr. Toniann Fail with hospitalist service who will see the patient for admission and place holding orders himself. Patient and family have been updated with this plan. Patient denies using any drugs recently. I think at this time we can hold on giving IV fomepizole. He denies any ingestions at all.  Pam,  girlfriend, [phone] Eunice Blase, sister, [phone]   4:00 AM  Pt stable.  States "I feel well".  Denies any acute complaints. Still tachycardic but has been transitioned to nasal cannula. No hypertension. No focal neurologic deficits noted. Denies chest pain, shortness of breath. Again denies drug abuse. Toxic alcohol panel pending at [facility]. He denies any ingestion of antifreeze.  Hospitalist paged by nursing staff for admission orders.   EKG Interpretation  Date/Time:  Monday January 07 2017 00:11:42 EST Ventricular Rate:  116 PR Interval:    QRS Duration: 89 QT Interval:  320 QTC Calculation: 445 R Axis:   26 Text Interpretation:  Sinus tachycardia Atrial premature complex Probable left atrial enlargement Borderline T abnormalities, anterior leads Baseline wander in lead(s) V3 No old tracing to compare Confirmed by Aradia Estey,  DO, Philopateer Strine ([id number]) on 01/07/2017 12:42:32 AM        CRITICAL CARE Performed by: Raelyn Number   Total critical care time: 65 minutes  Critical care time was exclusive of separately billable procedures and treating other patients.  Critical care was necessary to treat or prevent imminent or life-threatening deterioration.  Critical care was time spent personally by me on the following activities: development of treatment plan with patient and/or surrogate  as well as nursing, discussions with consultants, evaluation of patient's response to treatment, examination of patient, obtaining history from patient or surrogate, ordering and performing treatments and interventions, ordering and review of laboratory studies, ordering and review of radiographic studies, pulse oximetry and re-evaluation of patient's condition.     Layla Maw Lewanda Perea, DO 01/07/17 0246    Layla Maw Hartley Wyke, DO 01/07/17 [phone]

## 2017-01-07 NOTE — ED Notes (Signed)
Pt given lunch tray.

## 2017-01-07 NOTE — ED Notes (Signed)
Gave lunch tray 

## 2017-01-07 NOTE — Progress Notes (Signed)
PROGRESS NOTE                                                                                                                                                                                                             Patient Demographics:    Darren Lane, is a 56 y.o. male, DOB - [birth year], YNW:[id number]  Admit date - 01/07/2017   Admitting Physician No admitting provider for patient encounter.  Outpatient Primary MD for the patient is No primary care provider on file.  LOS - 0  Chief Complaint  Patient presents with  . Drug Overdose       Brief Narrative  Darren Lane is a 56 y.o. male with history of asthma was brought to the ER after patient was found to be unresponsive by patient's friend last night around 11 PM. As per the patient's friend patient was doing fine when she left home at around 3 PM. When she came back home patient was lying on the bed unresponsive with labored breathing, further workup showed small left cerebellar stroke on MRI, normal anion gap metabolic acidosis. Neuro was consulted.   Subjective:    Darren Lane today has, No headache, No chest pain, No abdominal pain - No Nausea, No new weakness tingling or numbness, No Cough - SOB.     Assessment  & Plan :    1.Encephalopathy. Definitely left cerebellar stroke which is acute is playing a role however toxic encephalopathy cannot be ruled out, he did respond to Narcan however urine drug screen was negative, I question if he is using synthetic drug which has not picked on routine drug screens. Girlfriend does not give a clear-cut answer, for now placed on aspirin, EEG unremarkable, neuro consulted. Monitor with supportive care.  2. Acute left cerebellar stroke. Full stroke protocol, on aspirin now.  3. Smoking. Counseled to quit  4. Normal anion gap metabolic acidosis. Bicarbonate is stable, alcohol screen is negative, monitor with supportive  care.  5. ARF. Improving with hydration.  6. Elevated LFTs. Check acute hepatitis panel, check right upper quadrant ultrasound, ammonia borderline will place on lactulose.  7. Asthma. Stable no wheezing, supportive care with oxygen and nebulizer treatments as needed.  8. ? community-acquired pneumonia. Follow on empiric antibiotics.  9. Mild elevation in troponin. Trend is flat, this is non-ACS, no chest pain,  EKG nonacute, on aspirin, check echo to evaluate wall motion and EF.    Family Communication  :  Girlfriend  Code Status : Full  Diet :   nothing by mouth till he passes swallow screen  Disposition Plan  :  Stay inpatient  Consults  : Neuro  Procedures  :    CT and MRI brain. Confirming small left cerebellar stroke.  EEG. Nonspecific  Echocardiogram  Carotid Duplex  DVT Prophylaxis  :    Heparin   Lab Results  Component Value Date   PLT 333 01/07/2017    Inpatient Medications  Scheduled Meds: . aspirin  81 mg Oral Daily  . lactulose  10 g Oral TID   Continuous Infusions: . sodium chloride 125 mL/hr at 01/07/17 0753  . azithromycin Stopped (01/07/17 1223)  . cefTRIAXone (ROCEPHIN)  IV Stopped (01/07/17 0858)   PRN Meds:.acetaminophen **OR** acetaminophen, ondansetron **OR** ondansetron (ZOFRAN) IV  Antibiotics  :    Anti-infectives    Start     Dose/Rate Route Frequency Ordered Stop   01/07/17 0600  cefTRIAXone (ROCEPHIN) 1 g in dextrose 5 % 50 mL IVPB     1 g 100 mL/hr over 30 Minutes Intravenous Every 24 hours 01/07/17 0535     01/07/17 0545  azithromycin (ZITHROMAX) 500 mg in dextrose 5 % 250 mL IVPB     500 mg 250 mL/hr over 60 Minutes Intravenous Every 24 hours 01/07/17 0532     01/07/17 0130  vancomycin (VANCOCIN) IVPB 1000 mg/200 mL premix     1,000 mg 200 mL/hr over 60 Minutes Intravenous  Once 01/07/17 0115 01/07/17 0725   01/07/17 0130  piperacillin-tazobactam (ZOSYN) IVPB 3.375 g     3.375 g 100 mL/hr over 30 Minutes Intravenous   Once 01/07/17 0115 01/07/17 0406         Objective:   Vitals:   01/07/17 1221 01/07/17 1222 01/07/17 1300 01/07/17 1400  BP: 105/73  114/59 100/73  Pulse: 108  111 101  Resp: 13  21   Temp: 100.4 F (38 C)     TempSrc: Oral     SpO2: (!) 84% (S) 93%  (!) 83%    Wt Readings from Last 3 Encounters:  No data found for Wt     Intake/Output Summary (Last 24 hours) at 01/07/17 1446 Last data filed at 01/07/17 1223  Gross per 24 hour  Intake             3550 ml  Output              200 ml  Net             3350 ml     Physical Exam  Drowsy but arousable, follows basic commands, oriented 2, No new F.N deficits, Normal affect [place].AT,PERRAL Supple Neck,No JVD, No cervical lymphadenopathy appriciated.  Symmetrical Chest wall movement, Good air movement bilaterally, CTAB RRR,No Gallops,Rubs or new Murmurs, No Parasternal Heave +ve B.Sounds, Abd Soft, No tenderness, No organomegaly appriciated, No rebound - guarding or rigidity. No Cyanosis, Clubbing or edema, No new Rash or bruise       Data Review:    CBC  Recent Labs Lab 01/07/17 0004 01/07/17 0013 01/07/17 0556  WBC 23.0*  --  15.3*  HGB 15.3 16.0 14.3  HCT 45.9 47.0 43.2  PLT 333  --  333  MCV 91.6  --  90.8  MCH 30.5  --  30.0  MCHC 33.3  --  33.1  RDW 13.2  --  12.9  LYMPHSABS 0.9  --  1.1  MONOABS 2.3*  --  0.5  EOSABS 0.0  --  0.0  BASOSABS 0.0  --  0.0    Chemistries   Recent Labs Lab 01/07/17 0004 01/07/17 0013 01/07/17 0556 01/07/17 0804  NA 135 136 137 138  K 4.5 4.5 4.9 4.9  CL 99* 101 104 106  CO2 21*  --  22 23  GLUCOSE 200* 202* 110* 115*  BUN 17 20 18 19   CREATININE 1.48* 1.60* 1.36* 1.19  CALCIUM 9.1  --  8.1* 8.2*  AST 43*  --  57*  --   ALT 41  --  47  --   ALKPHOS 42  --  35*  --   BILITOT 0.3  --  1.0  --    ------------------------------------------------------------------------------------------------------------------ No results for input(s): CHOL, HDL, LDLCALC,  TRIG, CHOLHDL, LDLDIRECT in the last 72 hours.  No results found for: HGBA1C ------------------------------------------------------------------------------------------------------------------ No results for input(s): TSH, T4TOTAL, T3FREE, THYROIDAB in the last 72 hours.  Invalid input(s): FREET3 ------------------------------------------------------------------------------------------------------------------ No results for input(s): VITAMINB12, FOLATE, FERRITIN, TIBC, IRON, RETICCTPCT in the last 72 hours.  Coagulation profile No results for input(s): INR, PROTIME in the last 168 hours.  No results for input(s): DDIMER in the last 72 hours.  Cardiac Enzymes  Recent Labs Lab 01/07/17 0149 01/07/17 0804 01/07/17 1137  TROPONINI 0.29* 0.43* 0.30*   ------------------------------------------------------------------------------------------------------------------    Component Value Date/Time   BNP 69.7 01/07/2017 0149    Micro Results No results found for this or any previous visit (from the past 240 hour(s)).  Radiology Reports Ct Head Wo Contrast  Result Date: 01/07/2017 CLINICAL DATA:  56 y/o M; altered mental status following possible overdose. All EXAM: CT HEAD WITHOUT CONTRAST CT CERVICAL SPINE WITHOUT CONTRAST TECHNIQUE: Multidetector CT imaging of the head and cervical spine was performed following the standard protocol without intravenous contrast. Multiplanar CT image reconstructions of the cervical spine were also generated. COMPARISON:  None. FINDINGS: CT HEAD FINDINGS Brain: No evidence of acute infarction, hemorrhage, hydrocephalus, extra-axial collection or mass lesion/mass effect. Small lucencies in left lentiform nucleus at insula may represent prominent perivascular spaces or age-indeterminate lacunar infarcts. Vascular: No hyperdense vessel or unexpected calcification. Skull: Normal. Negative for fracture or focal lesion. Sinuses/Orbits: Partial opacification of  paranasal sinuses with aerosolized secretions and fluid levels. Normally pneumatized mastoid air cells. Orbits are unremarkable. Other: Left-sided nasal airway to urinating in the hypopharynx. CT CERVICAL SPINE FINDINGS Alignment: Straightening of cervical lordosis without listhesis or dislocation. Skull base and vertebrae: No acute fracture. No primary bone lesion or focal pathologic process. Soft tissues and spinal canal: No prevertebral fluid or swelling. No visible canal hematoma. Disc levels: Mild cervical spondylosis with minimal disc space narrowing and marginal osteophytosis at the C5 through C7 levels. Right-sided upper cervical facet degenerative changes. No high-grade bony canal stenosis. Upper chest: Emphysema of lung apices. Other: Debris within the oral and hypopharynx. IMPRESSION: 1. No acute intracranial abnormality identified. 2. Small lucencies in left lentiform nucleus and insula may represent prominent perivascular spaces or age-indeterminate lacunar infarcts. 3. Opacification of paranasal sinuses with fluid levels possibly due to the presence of nasal tube. 4. No acute fracture or dislocation of cervical spine. 5. Lung apex emphysema. Electronically Signed   By: Mitzi HansenLance  Furusawa-Stratton M.D.   On: 01/07/2017 01:14   Ct Cervical Spine Wo Contrast  Result Date: 01/07/2017 CLINICAL DATA:  56 y/o M; altered mental status following  possible overdose. All EXAM: CT HEAD WITHOUT CONTRAST CT CERVICAL SPINE WITHOUT CONTRAST TECHNIQUE: Multidetector CT imaging of the head and cervical spine was performed following the standard protocol without intravenous contrast. Multiplanar CT image reconstructions of the cervical spine were also generated. COMPARISON:  None. FINDINGS: CT HEAD FINDINGS Brain: No evidence of acute infarction, hemorrhage, hydrocephalus, extra-axial collection or mass lesion/mass effect. Small lucencies in left lentiform nucleus at insula may represent prominent perivascular spaces or  age-indeterminate lacunar infarcts. Vascular: No hyperdense vessel or unexpected calcification. Skull: Normal. Negative for fracture or focal lesion. Sinuses/Orbits: Partial opacification of paranasal sinuses with aerosolized secretions and fluid levels. Normally pneumatized mastoid air cells. Orbits are unremarkable. Other: Left-sided nasal airway to urinating in the hypopharynx. CT CERVICAL SPINE FINDINGS Alignment: Straightening of cervical lordosis without listhesis or dislocation. Skull base and vertebrae: No acute fracture. No primary bone lesion or focal pathologic process. Soft tissues and spinal canal: No prevertebral fluid or swelling. No visible canal hematoma. Disc levels: Mild cervical spondylosis with minimal disc space narrowing and marginal osteophytosis at the C5 through C7 levels. Right-sided upper cervical facet degenerative changes. No high-grade bony canal stenosis. Upper chest: Emphysema of lung apices. Other: Debris within the oral and hypopharynx. IMPRESSION: 1. No acute intracranial abnormality identified. 2. Small lucencies in left lentiform nucleus and insula may represent prominent perivascular spaces or age-indeterminate lacunar infarcts. 3. Opacification of paranasal sinuses with fluid levels possibly due to the presence of nasal tube. 4. No acute fracture or dislocation of cervical spine. 5. Lung apex emphysema. Electronically Signed   By: Mitzi Hansen M.D.   On: 01/07/2017 01:14   Mr Angiogram Head Wo Contrast  Result Date: 01/07/2017 CLINICAL DATA:  56 year old male found unresponsive. Left cerebellar infarct. Subsequent encounter. EXAM: MRA HEAD WITHOUT CONTRAST TECHNIQUE: Angiographic images of the Circle of Willis were obtained using MRA technique without intravenous contrast. COMPARISON:  01/07/2017 brain MR and head CT. FINDINGS: Exam is motion degraded. This limits evaluation for detection of an aneurysm or adequately grading stenosis. Anterior circulation  without large size vessel significant stenosis or occlusion. Mild ectasia distal cervical segment left internal carotid artery. Fetal contribution to posterior cerebral artery with mild prominence origin of the left posterior communicating artery. Middle cerebral artery branch vessel irregularity and narrowing bilaterally, more notable on the left. No significant stenosis of the distal vertebral arteries or basilar artery. Small left posterior inferior cerebellar artery. Moderately large left anterior inferior cerebellar artery with areas of mild narrowing. Poor delineation of the right posterior inferior cerebellar artery. Moderately large right anterior inferior cerebellar artery. Mild to moderate narrowing mid to distal aspect left superior cerebellar artery. IMPRESSION: Exam is motion degraded. No significant stenosis distal vertebral arteries or basilar artery. Mild to moderate narrowing mid to distal aspect left superior cerebellar artery. Poor delineation of the right posterior inferior cerebellar artery. Mild narrowing of portions of the left anterior inferior cerebellar artery. Anterior circulation without large size vessel significant stenosis or occlusion. Narrowing and irregularity of middle cerebral artery branches greater on the left. Fetal contribution to posterior cerebral artery bilaterally. Electronically Signed   By: Lacy Duverney M.D.   On: 01/07/2017 08:20   Mr Brain Wo Contrast  Result Date: 01/07/2017 CLINICAL DATA:  Found unresponsive in bit head.  Labored breathing. EXAM: MRI HEAD WITHOUT CONTRAST TECHNIQUE: Multiplanar, multiecho pulse sequences of the brain and surrounding structures were obtained without intravenous contrast. COMPARISON:  CT same day FINDINGS: Brain: Diffusion imaging shows a 5 mm acute  infarction within the left cerebellum. No other acute infarction. Brainstem and cerebellum are otherwise normal. Cerebral hemispheres are normal. No mass lesion, hemorrhage,  hydrocephalus or extra-axial collection. No pituitary mass. Vascular: Major vessels at the base of the brain show flow. Skull and upper cervical spine: Negative Sinuses/Orbits: Inflammatory changes throughout the paranasal sinuses. Mastoids are clear. Orbits are negative. 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 The study is otherwise normal except for a inflammatory changes of the paranasal sinuses. Electronically Signed   By: Paulina Fusi M.D.   On: 01/07/2017 07:17   Dg Chest Port 1 View  Result Date: 01/07/2017 CLINICAL DATA:  56 y/o  M; found unresponsive.  History of drug use. EXAM: PORTABLE CHEST 1 VIEW COMPARISON:  None. FINDINGS: Asymmetric hazy opacification of the left lung may represent edema or layering pleural effusion. Low lung volumes. Cardiac silhouette with no normal limits given projection and technique. Bones are unremarkable. IMPRESSION: Asymmetric hazy opacification of left lung may represent edema or layering pleural effusion. Electronically Signed   By: Mitzi Hansen M.D.   On: 01/07/2017 00:32    Time Spent in minutes  30   Marvion Bastidas K M.D on 01/07/2017 at 2:46 PM  Between 7am to 7pm - Pager - [phone]  After 7pm go to [email or web address] - password [facility]  [facility] -  Office  [phone]

## 2017-01-07 NOTE — ED Notes (Signed)
Pt returned to room from imaging department.  

## 2017-01-07 NOTE — ED Notes (Signed)
Darren Lane at bedside

## 2017-01-07 NOTE — ED Notes (Signed)
Sp[facility] with Onnie GrahamMaryanne Mceachern[facility] & provided update on pt & the admitting MDs phone number

## 2017-01-07 NOTE — Progress Notes (Signed)
EEG completed; results pending.    

## 2017-01-07 NOTE — ED Notes (Signed)
Spoke with main lab about a courier. Main lab said that the lab needs to go to [facility] first and that it can be sent and ran stat

## 2017-01-07 NOTE — ED Notes (Signed)
[facility] lab called again for lab code

## 2017-01-07 NOTE — H&P (Signed)
History and Physical    Darren Chamberlan R Kissoon JXB:[id number] DOB: October 28, 1961 DOA: 01/07/2017  PCP: No primary care provider on file.  Patient coming from: Home.  History obtained from ER physician, patient's friend and patient.  Chief Complaint: Altered mental status and unresponsiveness.  HPI: Darren Lane is a 56 y.o. male with history of asthma was brought to the ER after patient was found to be unresponsive by patient's friend last night around 11 PM. As per the patient's friend patient was doing fine when she left home at around 3 PM. When she came back home patient was lying on the bed unresponsive with labored breathing. EMS was called and patient was given Narcan following which patient's breathing improved. In the ER patient was found to have pinpoint pupils. CT head was done which was showing abnormality concerning for stroke and neurologist on call as we initially consulted. Patient was given another dose of Narcan in the ER and became alert and awake. Lab work show leukocytosis and anion gap metabolic acidosis and renal failure. Patient states did use antifreeze yesterday to fill the car but denies drinking any antifreeze. Patient drinks alcohol only very occasionally. Denies using drugs. Urine drug screen came negative. Blood alcohol levels including methanol and ethylene glycol levels have been sent. On exam patient is alert awake oriented to time place and person but occasionally dozes off. Denies any chest pain shortness of breath nausea vomiting headache or any use of new drugs. Patient will be admitted for further management of acute encephalopathy and possible pneumonia. Patient's troponin also has come mildly elevated. But patient denies any chest pain.  ED Course: Chest Xray shows possible infiltrates and pleural effusion. Blood work was showing leukocytosis and anion gap metabolic acidosis with renal failure. CT head was showing abnormalities concerning for possible stroke. Blood methanol  level, ethylene glycol and isopropyl alcohol levels have been sent. Troponin came mildly elevated.  Review of Systems: As per HPI, rest all negative.   Past Medical History:  Diagnosis Date  . Asthma     History reviewed. No pertinent surgical history.   reports that he has never smoked. He has never used smokeless tobacco. He reports that he drinks alcohol. His drug history is not on file.  No Known Allergies  Family History  Problem Relation Age of Onset  . Asthma Neg Hx   . Diabetes Mellitus II Neg Hx     Prior to Admission medications   Not on File    Physical Exam: Vitals:   01/07/17 0256 01/07/17 0257 01/07/17 0258 01/07/17 0339  BP:   135/75   Pulse: 115 107 112 118  Resp: 23 25 17 13   Temp:      TempSrc:      SpO2: 100% 100% 100% 100%      Constitutional: Moderately built and nourished. Vitals:   01/07/17 0256 01/07/17 0257 01/07/17 0258 01/07/17 0339  BP:   135/75   Pulse: 115 107 112 118  Resp: 23 25 17 13   Temp:      TempSrc:      SpO2: 100% 100% 100% 100%   Eyes: Anicteric no pallor. Pupils are pinpoint. ENMT: No discharge from the ears eyes nose and mouth. Neck: No mass felt. No neck rigidity. Respiratory: No rhonchi or crepitations. Cardiovascular: S1-S2 heard. Abdomen: Soft nontender bowel sounds present. Musculoskeletal: No edema. Skin: No rash. Neurologic: Alert awake oriented to time place and person but dozes off occasionally. Moves all extremities 5 x  5. Psychiatric: Denies any suicidal ideation.   Labs on Admission: I have personally reviewed following labs and imaging studies  CBC:  Recent Labs Lab 01/07/17 0004 01/07/17 0013  WBC 23.0*  --   NEUTROABS 19.7*  --   HGB 15.3 16.0  HCT 45.9 47.0  MCV 91.6  --   PLT 333  --    Basic Metabolic Panel:  Recent Labs Lab 01/07/17 0004 01/07/17 0013  NA 135 136  K 4.5 4.5  CL 99* 101  CO2 21*  --   GLUCOSE 200* 202*  BUN 17 20  CREATININE 1.48* 1.60*  CALCIUM 9.1  --     GFR: CrCl cannot be calculated (Unknown ideal weight.). Liver Function Tests:  Recent Labs Lab 01/07/17 0004  AST 43*  ALT 41  ALKPHOS 42  BILITOT 0.3  PROT 7.7  ALBUMIN 4.4   No results for input(s): LIPASE, AMYLASE in the last 168 hours. No results for input(s): AMMONIA in the last 168 hours. Coagulation Profile: No results for input(s): INR, PROTIME in the last 168 hours. Cardiac Enzymes:  Recent Labs Lab 01/07/17 0149  CKTOTAL 546*  TROPONINI 0.29*   BNP (last 3 results) No results for input(s): PROBNP in the last 8760 hours. HbA1C: No results for input(s): HGBA1C in the last 72 hours. CBG:  Recent Labs Lab 01/07/17 0003  GLUCAP 202*   Lipid Profile: No results for input(s): CHOL, HDL, LDLCALC, TRIG, CHOLHDL, LDLDIRECT in the last 72 hours. Thyroid Function Tests: No results for input(s): TSH, T4TOTAL, FREET4, T3FREE, THYROIDAB in the last 72 hours. Anemia Panel: No results for input(s): VITAMINB12, FOLATE, FERRITIN, TIBC, IRON, RETICCTPCT in the last 72 hours. Urine analysis:    Component Value Date/Time   COLORURINE YELLOW 01/07/2017 0018   APPEARANCEUR HAZY (A) 01/07/2017 0018   LABSPEC 1.008 01/07/2017 0018   PHURINE 6.0 01/07/2017 0018   GLUCOSEU 150 (A) 01/07/2017 0018   HGBUR SMALL (A) 01/07/2017 0018   BILIRUBINUR NEGATIVE 01/07/2017 0018   KETONESUR NEGATIVE 01/07/2017 0018   PROTEINUR 100 (A) 01/07/2017 0018   NITRITE NEGATIVE 01/07/2017 0018   LEUKOCYTESUR NEGATIVE 01/07/2017 0018   Sepsis Labs: @LABRCNTIP (procalcitonin:4,lacticidven:4) )No results found for this or any previous visit (from the past 240 hour(s)).   Radiological Exams on Admission: Ct Head Wo Contrast  Result Date: 01/07/2017 CLINICAL DATA:  56 y/o M; altered mental status following possible overdose. All EXAM: CT HEAD WITHOUT CONTRAST CT CERVICAL SPINE WITHOUT CONTRAST TECHNIQUE: Multidetector CT imaging of the head and cervical spine was performed following the  standard protocol without intravenous contrast. Multiplanar CT image reconstructions of the cervical spine were also generated. COMPARISON:  None. FINDINGS: CT HEAD FINDINGS Brain: No evidence of acute infarction, hemorrhage, hydrocephalus, extra-axial collection or mass lesion/mass effect. Small lucencies in left lentiform nucleus at insula may represent prominent perivascular spaces or age-indeterminate lacunar infarcts. Vascular: No hyperdense vessel or unexpected calcification. Skull: Normal. Negative for fracture or focal lesion. Sinuses/Orbits: Partial opacification of paranasal sinuses with aerosolized secretions and fluid levels. Normally pneumatized mastoid air cells. Orbits are unremarkable. Other: Left-sided nasal airway to urinating in the hypopharynx. CT CERVICAL SPINE FINDINGS Alignment: Straightening of cervical lordosis without listhesis or dislocation. Skull base and vertebrae: No acute fracture. No primary bone lesion or focal pathologic process. Soft tissues and spinal canal: No prevertebral fluid or swelling. No visible canal hematoma. Disc levels: Mild cervical spondylosis with minimal disc space narrowing and marginal osteophytosis at the C5 through C7 levels. Right-sided upper cervical  facet degenerative changes. No high-grade bony canal stenosis. Upper chest: Emphysema of lung apices. Other: Debris within the oral and hypopharynx. IMPRESSION: 1. No acute intracranial abnormality identified. 2. Small lucencies in left lentiform nucleus and insula may represent prominent perivascular spaces or age-indeterminate lacunar infarcts. 3. Opacification of paranasal sinuses with fluid levels possibly due to the presence of nasal tube. 4. No acute fracture or dislocation of cervical spine. 5. Lung apex emphysema. Electronically Signed   By: Mitzi Hansen M.D.   On: 01/07/2017 01:14   Ct Cervical Spine Wo Contrast  Result Date: 01/07/2017 CLINICAL DATA:  56 y/o M; altered mental status  following possible overdose. All EXAM: CT HEAD WITHOUT CONTRAST CT CERVICAL SPINE WITHOUT CONTRAST TECHNIQUE: Multidetector CT imaging of the head and cervical spine was performed following the standard protocol without intravenous contrast. Multiplanar CT image reconstructions of the cervical spine were also generated. COMPARISON:  None. FINDINGS: CT HEAD FINDINGS Brain: No evidence of acute infarction, hemorrhage, hydrocephalus, extra-axial collection or mass lesion/mass effect. Small lucencies in left lentiform nucleus at insula may represent prominent perivascular spaces or age-indeterminate lacunar infarcts. Vascular: No hyperdense vessel or unexpected calcification. Skull: Normal. Negative for fracture or focal lesion. Sinuses/Orbits: Partial opacification of paranasal sinuses with aerosolized secretions and fluid levels. Normally pneumatized mastoid air cells. Orbits are unremarkable. Other: Left-sided nasal airway to urinating in the hypopharynx. CT CERVICAL SPINE FINDINGS Alignment: Straightening of cervical lordosis without listhesis or dislocation. Skull base and vertebrae: No acute fracture. No primary bone lesion or focal pathologic process. Soft tissues and spinal canal: No prevertebral fluid or swelling. No visible canal hematoma. Disc levels: Mild cervical spondylosis with minimal disc space narrowing and marginal osteophytosis at the C5 through C7 levels. Right-sided upper cervical facet degenerative changes. No high-grade bony canal stenosis. Upper chest: Emphysema of lung apices. Other: Debris within the oral and hypopharynx. IMPRESSION: 1. No acute intracranial abnormality identified. 2. Small lucencies in left lentiform nucleus and insula may represent prominent perivascular spaces or age-indeterminate lacunar infarcts. 3. Opacification of paranasal sinuses with fluid levels possibly due to the presence of nasal tube. 4. No acute fracture or dislocation of cervical spine. 5. Lung apex emphysema.  Electronically Signed   By: Mitzi Hansen M.D.   On: 01/07/2017 01:14   Dg Chest Port 1 View  Result Date: 01/07/2017 CLINICAL DATA:  56 y/o  M; found unresponsive.  History of drug use. EXAM: PORTABLE CHEST 1 VIEW COMPARISON:  None. FINDINGS: Asymmetric hazy opacification of the left lung may represent edema or layering pleural effusion. Low lung volumes. Cardiac silhouette with no normal limits given projection and technique. Bones are unremarkable. IMPRESSION: Asymmetric hazy opacification of left lung may represent edema or layering pleural effusion. Electronically Signed   By: Mitzi Hansen M.D.   On: 01/07/2017 00:32    EKG: Independently reviewed. Sinus tachycardia.  Assessment/Plan Principal Problem:   Acute encephalopathy Active Problems:   Community acquired pneumonia    1. Acute encephalopathy - cause not clear. Patient did respond to Narcan. I'm ordering repeat ABG levels along with carbon monoxide levels. I'm also ordering repeat UA to check for any crystals suggestive of ethylene glycol. Repeat metabolic panel has been ordered stat. Follow blood levels for ethylene glycol methanol and isopropyl alcohol which have been sent. Check ammonia levels. Closely monitor in stepdown. MRI brain has been ordered to check for the abnormality in CT scan. Did talk to neurology. If MRI is positive for stroke will get neurology workup. Check  EEG. Follow blood drug screen. 2. Possible pneumonia - patient's blood work showed leukocytosis. Blood cultures influenza PCR sputum cultures urine for Legionella strep antigen and HIV status has been ordered. Patient is on ceftriaxone and Zithromax. 3. Acute renal failure - check urine FENa. No old labs to compare. Continue with hydration. Closely follow intake output and metabolic panel. 4. Elevated troponin - we'll cycle cardiac markers. Check 2-D echo. Aspirin. 5. History of asthma - presently not wheezing. 6. Elevated LFTs - check  acute hepatitis panel for LFTs. Check ammonia levels.   DVT prophylaxis: SCD for now. Code Status: Full code.  Family Communication: Patient's friend at the bedside.  Disposition Plan: Home.  Consults called: None.  Admission status: Observation.    Eduard Clos MD [facility] Pager [phone].  If 7PM-7AM, please contact night-coverage [email or web address] Password [email or web address]  01/07/2017, 5:07 AM

## 2017-01-07 NOTE — ED Notes (Signed)
Admitting MD paged and notified of pt's troponin. Sts pt is okay to go to [facility].

## 2017-01-08 ENCOUNTER — Inpatient Hospital Stay (HOSPITAL_COMMUNITY): Payer: Self-pay

## 2017-01-08 ENCOUNTER — Encounter (HOSPITAL_COMMUNITY): Payer: Self-pay | Admitting: *Deleted

## 2017-01-08 LAB — CBC
HEMATOCRIT: 35.8 % — AB (ref 39.0–52.0)
HEMOGLOBIN: 11.8 g/dL — AB (ref 13.0–17.0)
MCH: 30.2 pg (ref 26.0–34.0)
MCHC: 33 g/dL (ref 30.0–36.0)
MCV: 91.6 fL (ref 78.0–100.0)
Platelets: 227 10*3/uL (ref 150–400)
RBC: 3.91 MIL/uL — AB (ref 4.22–5.81)
RDW: 13.3 % (ref 11.5–15.5)
WBC: 8.8 10*3/uL (ref 4.0–10.5)

## 2017-01-08 LAB — HEPATITIS PANEL, ACUTE
HCV Ab: <0.1 s/co ratio (ref 0.0–0.9)
Hep A IgM: NEGATIVE
Hep B C IgM: NEGATIVE
Hepatitis B Surface Ag: NEGATIVE

## 2017-01-08 LAB — GLUCOSE, CAPILLARY
GLUCOSE-CAPILLARY: 135 mg/dL — AB (ref 65–99)
GLUCOSE-CAPILLARY: 124 mg/dL — AB (ref 65–99)
GLUCOSE-CAPILLARY: 116 mg/dL — AB (ref 65–99)
GLUCOSE-CAPILLARY: 120 mg/dL — AB (ref 65–99)
Glucose-Capillary: 156 mg/dL — ABNORMAL HIGH (ref 65–99)

## 2017-01-08 LAB — LEGIONELLA PNEUMOPHILA SEROGP 1 UR AG: L. pneumophila Serogp 1 Ur Ag: NEGATIVE

## 2017-01-08 LAB — INFLUENZA PANEL BY PCR (TYPE A & B)
INFLBPCR: NEGATIVE
Influenza A By PCR: NEGATIVE

## 2017-01-08 LAB — MRSA PCR SCREENING: MRSA by PCR: NEGATIVE

## 2017-01-08 MED ORDER — SODIUM CHLORIDE 0.9 % IV BOLUS (SEPSIS)
1000.0000 mL | Freq: Once | INTRAVENOUS | Status: AC
  Administered 2017-01-08: 1000 mL via INTRAVENOUS

## 2017-01-08 MED ORDER — ORAL CARE MOUTH RINSE
15.0000 mL | Freq: Two times a day (BID) | OROMUCOSAL | Status: DC
  Administered 2017-01-08: 15 mL via OROMUCOSAL

## 2017-01-08 MED ORDER — SODIUM CHLORIDE 0.9 % IV SOLN
INTRAVENOUS | Status: DC
  Administered 2017-01-08 – 2017-01-09 (×2): via INTRAVENOUS

## 2017-01-08 MED ORDER — IOPAMIDOL (ISOVUE-370) INJECTION 76%
INTRAVENOUS | Status: AC
  Administered 2017-01-08: 50 mL
  Filled 2017-01-08: qty 50

## 2017-01-08 NOTE — Progress Notes (Signed)
STROKE TEAM PROGRESS NOTE   HISTORY OF PRESENT ILLNESS (per record) Darren Lane is an 56 y.o. male history of asthma was brought to the ER after patient was found to be unresponsive by patient's friend last night around 11 PM. As per the patient's friend patient was doing fine when he left home at around 3 PM on 01/06/2017 (LKW). When he came back home patient was lying on the bed unresponsive with labored breathing. EMS was called and patient was given Narcan followingwhich patient's breathing improved. In the ER patient was found to have pinpoint pupils. CT head was done which was showing abnormality concerning for stroke and neurologist on call as we initially consulted. Patient was given another dose of Narcan in the ER and became alert and awake. Lab work show leukocytosis and anion gap metabolic acidosis and renal failure. Patient states did use antifreeze yesterday to fill the car but denies drinking any antifreeze. Patient drinks alcoholonly very occasionally. Denies using drugs. Urine drug screen came negative. Blood alcohol levels including methanol and ethylene glycol levels have been sent. On exam patient is alert awake oriented to time place and person but occasionally dozes off.  During neurological consultation patient states that he did take Xanax however this was not found in his drug screen. During the consultation patient with commonly not often fall asleep but then quickly wake up when stimulated. Patient states that he's been under a lot of stress lately and has not been sleeping. Discussing with the wife was at bedside he does get approximate 7-8 hours sleep at night. Patient is very lethargic and has slurred speech. He is perseverating on being stressed out and working 2 jobs. Patient appears flushed at this point in time but is able to follow commands  ED Course:Chest Xray showspossible infiltrates and pleural effusion. Blood work was showing leukocytosis and anion gap metabolic  acidosis with renal failure. CT head was showing abnormalities concerning for possible stroke. Blood methanol level, ethylene glycol and isopropyl alcohol levels have been sent. Troponin came mildly elevated.  Patient was not administered IV t-PA secondary to unclear LKW. He was admitted to [facility] for further evaluation and treatment.   SUBJECTIVE (INTERVAL HISTORY) His wife Darren Lane is at the bedside.  They recounted HPI with DR. Renae Mottley. States he had a beer and laid down on the bed and went to sleep. He would not wake up. His breathing was not right. states he was downstairs with his neighbor watching a game, complains of being stressed. States he did not eat. He was not incontinent, he did not bite his tongue. When he arrived here he was confused. No hx of stroke. Denies taking pain medication. Denies using street drugs. Overall he feels his condition is rapidly improving.    OBJECTIVE Temp:  [98.2 F (36.8 C)-100.4 F (38 C)] 98.2 F (36.8 C) (01/09 0731) Pulse Rate:  [64-111] 80 (01/09 0836) Cardiac Rhythm: Normal sinus rhythm (01/08 2247) Resp:  [6-21] 16 (01/09 0836) BP: (93-132)/(56-83) 103/75 (01/09 0731) SpO2:  [83 %-99 %] 96 % (01/09 0836) Weight:  [87.8 kg (193 lb 9 oz)-87.8 kg (193 lb 9.6 oz)] 87.8 kg (193 lb 9 oz) (01/09 0500)  CBC:  Recent Labs Lab 01/07/17 0004  01/07/17 0556 01/08/17 0313  WBC 23.0*  --  15.3* 8.8  NEUTROABS 19.7*  --  13.7*  --   HGB 15.3  < > 14.3 11.8*  HCT 45.9  < > 43.2 35.8*  MCV 91.6  --  90.8 91.6  PLT 333  --  333 227  < > = values in this interval not displayed.  Basic Metabolic Panel:   Recent Labs Lab 01/07/17 0804 01/07/17 1406  NA 138 137  K 4.9 4.4  CL 106 106  CO2 23 22  GLUCOSE 115* 95  BUN 19 18  CREATININE 1.19 1.02  CALCIUM 8.2* 8.0*    Lipid Panel: No results found for: CHOL, TRIG, HDL, CHOLHDL, VLDL, LDLCALC HgbA1c: No results found for: HGBA1C Urine Drug Screen:     Component Value Date/Time    LABOPIA NONE DETECTED 01/07/2017 0018   COCAINSCRNUR NONE DETECTED 01/07/2017 0018   LABBENZ NONE DETECTED 01/07/2017 0018   AMPHETMU NONE DETECTED 01/07/2017 0018   THCU NONE DETECTED 01/07/2017 0018   LABBARB NONE DETECTED 01/07/2017 0018      IMAGING  Ct Head Wo Contrast 01/07/2017 1. No acute intracranial abnormality identified. 2. Small lucencies in left lentiform nucleus and insula may represent prominent perivascular spaces or age-indeterminate lacunar infarcts. 3. Opacification of paranasal sinuses with fluid levels possibly due to the presence of nasal tube. 4. No acute fracture or dislocation of cervical spine. 5. Lung apex emphysema.   Ct Cervical Spine Wo Contrast 01/07/2017 1. No acute intracranial abnormality identified. 2. Small lucencies in left lentiform nucleus and insula may represent prominent perivascular spaces or age-indeterminate lacunar infarcts. 3. Opacification of paranasal sinuses with fluid levels possibly due to the presence of nasal tube. 4. No acute fracture or dislocation of cervical spine. 5. Lung apex emphysema.   Mr Angiogram Head Wo Contrast 01/07/2017 Exam is motion degraded. No significant stenosis distal vertebral arteries or basilar artery. Mild to moderate narrowing mid to distal aspect left superior cerebellar artery. Poor delineation of the right posterior inferior cerebellar artery. Mild narrowing of portions of the left anterior inferior cerebellar artery. Anterior circulation without large size vessel significant stenosis or occlusion. Narrowing and irregularity of middle cerebral artery branches greater on the left. Fetal contribution to posterior cerebral artery bilaterally.   Mr Brain Wo Contrast 01/07/2017 5 mm acute infarction within the left cerebellum. The study is otherwise normal except for a inflammatory changes of the paranasal sinuses.   Dg Chest Port 1 View 01/07/2017 Asymmetric hazy opacification of left lung may represent edema or  layering pleural effusion.   US Abdomen Limited Ruq 01/08/2017 Heterogeneous hepatic parenchymal pattern suggesting fatty infiltration and/or hepatocellular disease . Exam otherwise unremarkable.   EEG  This predominantly drowsy and asleep EEG is abnormal due to mild to moderate diffuse slowing of the background, at times with triphasic-like appearance.   PHYSICAL EXAM Pleasant middle aged caucasian male not in distress.  . Afebrile. Head is nontraumatic. Neck is supple without bruit.    Cardiac exam no murmur or gallop. Lungs are clear to auscultation. Distal pulses are well felt. Neurological Exam ;  Awake  Alert oriented x 3. Normal speech and language.eye movements full without nystagmus.fundi were not visualized. Vision acuity and fields appear normal. Hearing is normal. Palatal movements are normal. Face symmetric. Tongue midline. Normal strength, tone, reflexes and coordination. Normal sensation. Gait deferred.  ASSESSMENT/PLAN Mr. Darren Lane is a 56 y.o. male with no significant past medical history presenting with confusion after being found unresponsive. He did not receive IV t-PA due to unclear last known well, unclear neuro symptoms.   Encephalopathy, not related to stroke Stroke:   left cerebellar infarct, etiology unclear. workup underway  MRI  L cerebellar infarct  MRA  No large vessel stenosis  CT cervical spine no acute abnormality, lung apex emphysema  Carotid Doppler  Added - > changed to CTA head and neck for better look at vasculature  2D Echo  pending   EEG - triphasic-like appearance.  LDL added  HgbA1c added  Heparin 5000 units sq tid for VTE prophylaxis    No antithrombotic prior to admission, now on aspirin 81 mg daily  Patient counseled to be compliant with his antithrombotic medications  Ongoing aggressive stroke risk factor management  Therapy recommendations:  OT, PT and SLP language added  Disposition:  pending   Tobacco  abuse  Current smoker  Smoking cessation counseling provided  Nicotine patch   Pt is willing to quit   Other Stroke Risk Factors  ETOH use, advised to drink no more than 2 drink(s) a day  UDS negative  Other Active Problems  Normal anion gap metabolic acidosis  ARF  Elevated LFTs  Asthma  ? Community-acquired PNA  Mildly elevated troponin   Hospital day # 1  Darren Lane,Darren Lane  [facility] See Amion for Pager information 01/08/2017 9:55 AM  I have personally examined this patient, reviewed notes, independently viewed imaging studies, participated in medical decision making and plan of care.ROS completed by me personally and pertinent positives fully documented  I have made any additions or clarifications directly to the above note. Agree with note above.  He presented with transient episode of unresponsiveness followed by labored breathing and confusion of unclear etiology. Possibilities include unwitnessed seizure doubt TIA. Toxic metabolic encephalopathy is also possible though current lab work is Negative. Recommend aspirin for stroke prevention and continuing ongoing stroke workup. Long discussion the bedside with the patient's wife and answered questions. Greater than 50% time during this 35 minute visit was spent on counseling and coordination of care about encephalopathy, seizure and TIA  Darren HeadyPramod Leila Schuff, MD Medical Director [facility] Pager: [phone] 01/08/2017 2:51 PM  To contact Stroke Continuity provider, please refer to [email or web address]. After hours, contact General Neurology

## 2017-01-08 NOTE — Progress Notes (Signed)
PROGRESS NOTE                                                                                                                                                                                                             Patient Demographics:    Darren Lane, is a 56 y.o. male, DOB - 1961-07-17, ZOX:[id number]  Admit date - 01/07/2017   Admitting Physician Eduard Clos, MD  Outpatient Primary MD for the patient is No primary care provider on file.  LOS - 1  Chief Complaint  Patient presents with  . Drug Overdose       Brief Narrative  Darren Lane is a 56 y.o. male with history of asthma was brought to the ER after patient was found to be unresponsive by patient's friend last night around 11 PM. As per the patient's friend patient was doing fine when she left home at around 3 PM. When she came back home patient was lying on the bed unresponsive with labored breathing, further workup showed small left cerebellar stroke on MRI, normal anion gap metabolic acidosis. Neuro was consulted.   Subjective:    Darren Lane today has, No headache, No chest pain, No abdominal pain - No Nausea, No new weakness tingling or numbness, No Cough - SOB.     Assessment  & Plan :    1.Encephalopathy. DefinitelyAcute left cerebellar stroke which played a role in his encephalopathy, however concurrent toxic encephalopathy cannot be ruled out, he did respond to Narcan however rapid urine drug screen was negative, an extensive 10 point drug screen is pending, I question if he is using synthetic drug which has not picked on routine drug screens.   Girlfriend does not give a clear-cut answer, for now placed on aspirin, EEG unremarkable, neuro consulted. Clinically he is better mentation close to baseline.  2. Acute left cerebellar stroke. Full stroke protocol, on aspirin now. Pending A1c and lipid panel.  No results found for: HGBA1C Lipid Panel    No results found for: CHOL, TRIG, HDL, CHOLHDL, VLDL, LDLCALC, LDLDIRECT    3. Smoking. Counseled to quit  4. Normal anion gap metabolic acidosis. Bicarbonate was stable, alcohol screen was negative, monitor with supportive care.  5. ARF. Resolved with hydration.  6. Elevated LFTs. Suspicious for alcohol abuse but he denies, negative acute hepatitis panel, ultrasound  shows fatty liver for which she needs to follow with GI outpatient, trend is stable, placed on lactulose for borderline ammonia levels.   7. Asthma. Stable no wheezing, supportive care with oxygen and nebulizer treatments as needed.  8. CAP. Follow on empiric antibiotics, not short of breath, and she afebrile, mild cough, leukocytosis has resolved.  9. Mild elevation in troponin. Trend is flat, this is non-ACS, no chest pain, EKG nonacute, on aspirin, check echo to evaluate wall motion and EF.    Family Communication  :  Girlfriend  Code Status : Full  Diet :   nothing by mouth till he passes swallow screen  Disposition Plan  :  Stay inpatient  Consults  : Neuro  Procedures  :    CT and MRI brain. Confirming small left cerebellar stroke.  EEG. Nonspecific  Right upper quadrant ultrasound. Fatty liver  Echocardiogram  Carotid Duplex  DVT Prophylaxis  :    Heparin   Lab Results  Component Value Date   PLT 227 01/08/2017    Inpatient Medications  Scheduled Meds: . iopamidol      . aspirin  81 mg Oral Daily  . azithromycin  500 mg Intravenous Q24H  . cefTRIAXone (ROCEPHIN)  IV  1 g Intravenous Q24H  . heparin subcutaneous  5,000 Units Subcutaneous Q8H  . lactulose  10 g Oral TID  . mouth rinse  15 mL Mouth Rinse BID  . sodium chloride  1,000 mL Intravenous Once   Continuous Infusions: . sodium chloride     PRN Meds:.acetaminophen **OR** acetaminophen, albuterol, ondansetron **OR** ondansetron (ZOFRAN) IV  Antibiotics  :    Anti-infectives    Start     Dose/Rate Route Frequency Ordered  Stop   01/07/17 0600  cefTRIAXone (ROCEPHIN) 1 g in dextrose 5 % 50 mL IVPB     1 g 100 mL/hr over 30 Minutes Intravenous Every 24 hours 01/07/17 0535     01/07/17 0545  azithromycin (ZITHROMAX) 500 mg in dextrose 5 % 250 mL IVPB     500 mg 250 mL/hr over 60 Minutes Intravenous Every 24 hours 01/07/17 0532     01/07/17 0130  vancomycin (VANCOCIN) IVPB 1000 mg/200 mL premix     1,000 mg 200 mL/hr over 60 Minutes Intravenous  Once 01/07/17 0115 01/07/17 0725   01/07/17 0130  piperacillin-tazobactam (ZOSYN) IVPB 3.375 g     3.375 g 100 mL/hr over 30 Minutes Intravenous  Once 01/07/17 0115 01/07/17 0406         Objective:   Vitals:   01/08/17 0500 01/08/17 0600 01/08/17 0731 01/08/17 0836  BP:  93/73 103/75   Pulse:  66 71 80  Resp:  12 17 16   Temp:   98.2 F (36.8 C)   TempSrc:   Oral   SpO2:  93% 95% 96%  Weight: 87.8 kg (193 lb 9 oz)     Height:        Wt Readings from Last 3 Encounters:  01/08/17 87.8 kg (193 lb 9 oz)     Intake/Output Summary (Last 24 hours) at 01/08/17 1051 Last data filed at 01/07/17 1223  Gross per 24 hour  Intake              250 ml  Output                0 ml  Net              250 ml  Physical Exam  Awake alert, oriented 3 today, No new F.N deficits, Normal affect Darren Lane,PERRAL Supple Neck,No JVD, No cervical lymphadenopathy appriciated.  Symmetrical Chest wall movement, Good air movement bilaterally, CTAB RRR,No Gallops,Rubs or new Murmurs, No Parasternal Heave +ve B.Sounds, Abd Soft, No tenderness, No organomegaly appriciated, No rebound - guarding or rigidity. No Cyanosis, Clubbing or edema, No new Rash or bruise       Data Review:    CBC  Recent Labs Lab 01/07/17 0004 01/07/17 0013 01/07/17 0556 01/08/17 0313  WBC 23.0*  --  15.3* 8.8  HGB 15.3 16.0 14.3 11.8*  HCT 45.9 47.0 43.2 35.8*  PLT 333  --  333 227  MCV 91.6  --  90.8 91.6  MCH 30.5  --  30.0 30.2  MCHC 33.3  --  33.1 33.0  RDW 13.2  --  12.9 13.3    LYMPHSABS 0.9  --  1.1  --   MONOABS 2.3*  --  0.5  --   EOSABS 0.0  --  0.0  --   BASOSABS 0.0  --  0.0  --     Chemistries   Recent Labs Lab 01/07/17 0004 01/07/17 0013 01/07/17 0556 01/07/17 0804 01/07/17 1406  NA 135 136 137 138 137  K 4.5 4.5 4.9 4.9 4.4  CL 99* 101 104 106 106  CO2 21*  --  22 23 22   GLUCOSE 200* 202* 110* 115* 95  BUN 17 20 18 19 18   CREATININE 1.48* 1.60* 1.36* 1.19 1.02  CALCIUM 9.1  --  8.1* 8.2* 8.0*  AST 43*  --  57*  --  81*  ALT 41  --  47  --  49  ALKPHOS 42  --  35*  --  33*  BILITOT 0.3  --  1.0  --  0.7   ------------------------------------------------------------------------------------------------------------------ No results for input(s): CHOL, HDL, LDLCALC, TRIG, CHOLHDL, LDLDIRECT in the last 72 hours.  No results found for: HGBA1C ------------------------------------------------------------------------------------------------------------------ No results for input(s): TSH, T4TOTAL, T3FREE, THYROIDAB in the last 72 hours.  Invalid input(s): FREET3 ------------------------------------------------------------------------------------------------------------------ No results for input(s): VITAMINB12, FOLATE, FERRITIN, TIBC, IRON, RETICCTPCT in the last 72 hours.  Coagulation profile No results for input(s): INR, PROTIME in the last 168 hours.  No results for input(s): DDIMER in the last 72 hours.  Cardiac Enzymes  Recent Labs Lab 01/07/17 0804 01/07/17 1137 01/07/17 1406  TROPONINI 0.43* 0.30* 0.34*   ------------------------------------------------------------------------------------------------------------------    Component Value Date/Time   BNP 69.7 01/07/2017 0149    Micro Results Recent Results (from the past 240 hour(s))  MRSA PCR Screening     Status: None   Collection Time: 01/07/17 10:45 PM  Result Value Ref Range Status   MRSA by PCR NEGATIVE NEGATIVE Final    Comment:        The GeneXpert MRSA Assay  (FDA approved for NASAL specimens only), is one component of a comprehensive MRSA colonization surveillance program. It is not intended to diagnose MRSA infection nor to guide or monitor treatment for MRSA infections.     Radiology Reports Ct Head Wo Contrast  Result Date: 01/07/2017 CLINICAL DATA:  56 y/o M; altered mental status following possible overdose. All EXAM: CT HEAD WITHOUT CONTRAST CT CERVICAL SPINE WITHOUT CONTRAST TECHNIQUE: Multidetector CT imaging of the head and cervical spine was performed following the standard protocol without intravenous contrast. Multiplanar CT image reconstructions of the cervical spine were also generated. COMPARISON:  None. FINDINGS: CT HEAD FINDINGS Brain: No evidence of  acute infarction, hemorrhage, hydrocephalus, extra-axial collection or mass lesion/mass effect. Small lucencies in left lentiform nucleus at insula may represent prominent perivascular spaces or age-indeterminate lacunar infarcts. Vascular: No hyperdense vessel or unexpected calcification. Skull: Normal. Negative for fracture or focal lesion. Sinuses/Orbits: Partial opacification of paranasal sinuses with aerosolized secretions and fluid levels. Normally pneumatized mastoid air cells. Orbits are unremarkable. Other: Left-sided nasal airway to urinating in the hypopharynx. CT CERVICAL SPINE FINDINGS Alignment: Straightening of cervical lordosis without listhesis or dislocation. Skull base and vertebrae: No acute fracture. No primary bone lesion or focal pathologic process. Soft tissues and spinal canal: No prevertebral fluid or swelling. No visible canal hematoma. Disc levels: Mild cervical spondylosis with minimal disc space narrowing and marginal osteophytosis at the C5 through C7 levels. Right-sided upper cervical facet degenerative changes. No high-grade bony canal stenosis. Upper chest: Emphysema of lung apices. Other: Debris within the oral and hypopharynx. IMPRESSION: 1. No acute  intracranial abnormality identified. 2. Small lucencies in left lentiform nucleus and insula may represent prominent perivascular spaces or age-indeterminate lacunar infarcts. 3. Opacification of paranasal sinuses with fluid levels possibly due to the presence of nasal tube. 4. No acute fracture or dislocation of cervical spine. 5. Lung apex emphysema. Electronically Signed   By: Mitzi Hansen M.D.   On: 01/07/2017 01:14   Ct Cervical Spine Wo Contrast  Result Date: 01/07/2017 CLINICAL DATA:  56 y/o M; altered mental status following possible overdose. All EXAM: CT HEAD WITHOUT CONTRAST CT CERVICAL SPINE WITHOUT CONTRAST TECHNIQUE: Multidetector CT imaging of the head and cervical spine was performed following the standard protocol without intravenous contrast. Multiplanar CT image reconstructions of the cervical spine were also generated. COMPARISON:  None. FINDINGS: CT HEAD FINDINGS Brain: No evidence of acute infarction, hemorrhage, hydrocephalus, extra-axial collection or mass lesion/mass effect. Small lucencies in left lentiform nucleus at insula may represent prominent perivascular spaces or age-indeterminate lacunar infarcts. Vascular: No hyperdense vessel or unexpected calcification. Skull: Normal. Negative for fracture or focal lesion. Sinuses/Orbits: Partial opacification of paranasal sinuses with aerosolized secretions and fluid levels. Normally pneumatized mastoid air cells. Orbits are unremarkable. Other: Left-sided nasal airway to urinating in the hypopharynx. CT CERVICAL SPINE FINDINGS Alignment: Straightening of cervical lordosis without listhesis or dislocation. Skull base and vertebrae: No acute fracture. No primary bone lesion or focal pathologic process. Soft tissues and spinal canal: No prevertebral fluid or swelling. No visible canal hematoma. Disc levels: Mild cervical spondylosis with minimal disc space narrowing and marginal osteophytosis at the C5 through C7 levels.  Right-sided upper cervical facet degenerative changes. No high-grade bony canal stenosis. Upper chest: Emphysema of lung apices. Other: Debris within the oral and hypopharynx. IMPRESSION: 1. No acute intracranial abnormality identified. 2. Small lucencies in left lentiform nucleus and insula may represent prominent perivascular spaces or age-indeterminate lacunar infarcts. 3. Opacification of paranasal sinuses with fluid levels possibly due to the presence of nasal tube. 4. No acute fracture or dislocation of cervical spine. 5. Lung apex emphysema. Electronically Signed   By: Mitzi Hansen M.D.   On: 01/07/2017 01:14   Mr Angiogram Head Wo Contrast  Result Date: 01/07/2017 CLINICAL DATA:  56 year old male found unresponsive. Left cerebellar infarct. Subsequent encounter. EXAM: MRA HEAD WITHOUT CONTRAST TECHNIQUE: Angiographic images of the Circle of Willis were obtained using MRA technique without intravenous contrast. COMPARISON:  01/07/2017 brain MR and head CT. FINDINGS: Exam is motion degraded. This limits evaluation for detection of an aneurysm or adequately grading stenosis. Anterior circulation without large size vessel  significant stenosis or occlusion. Mild ectasia distal cervical segment left internal carotid artery. Fetal contribution to posterior cerebral artery with mild prominence origin of the left posterior communicating artery. Middle cerebral artery branch vessel irregularity and narrowing bilaterally, more notable on the left. No significant stenosis of the distal vertebral arteries or basilar artery. Small left posterior inferior cerebellar artery. Moderately large left anterior inferior cerebellar artery with areas of mild narrowing. Poor delineation of the right posterior inferior cerebellar artery. Moderately large right anterior inferior cerebellar artery. Mild to moderate narrowing mid to distal aspect left superior cerebellar artery. IMPRESSION: Exam is motion degraded. No  significant stenosis distal vertebral arteries or basilar artery. Mild to moderate narrowing mid to distal aspect left superior cerebellar artery. Poor delineation of the right posterior inferior cerebellar artery. Mild narrowing of portions of the left anterior inferior cerebellar artery. Anterior circulation without large size vessel significant stenosis or occlusion. Narrowing and irregularity of middle cerebral artery branches greater on the left. Fetal contribution to posterior cerebral artery bilaterally. Electronically Signed   By: Lacy DuverneySteven  Olson M.D.   On: 01/07/2017 08:20   Mr Brain Wo Contrast  Result Date: 01/07/2017 CLINICAL DATA:  Found unresponsive in bit head.  Labored breathing. EXAM: MRI HEAD WITHOUT CONTRAST TECHNIQUE: Multiplanar, multiecho pulse sequences of the brain and surrounding structures were obtained without intravenous contrast. COMPARISON:  CT same day FINDINGS: Brain: Diffusion imaging shows a 5 mm acute infarction within the left cerebellum. No other acute infarction. Brainstem and cerebellum are otherwise normal. Cerebral hemispheres are normal. No mass lesion, hemorrhage, hydrocephalus or extra-axial collection. No pituitary mass. Vascular: Major vessels at the base of the brain show flow. Skull and upper cervical spine: Negative Sinuses/Orbits: Inflammatory changes throughout the paranasal sinuses. Mastoids are clear. Orbits are negative. 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 The study is otherwise normal except for a inflammatory changes of the paranasal sinuses. Electronically Signed   By: Paulina FusiMark  Shogry M.D.   On: 01/07/2017 07:17   Dg Chest Port 1 View  Result Date: 01/07/2017 CLINICAL DATA:  56 y/o  M; found unresponsive.  History of drug use. EXAM: PORTABLE CHEST 1 VIEW COMPARISON:  None. FINDINGS: Asymmetric hazy opacification of the left lung may represent edema or layering pleural effusion. Low lung volumes. Cardiac  silhouette with no normal limits given projection and technique. Bones are unremarkable. IMPRESSION: Asymmetric hazy opacification of left lung may represent edema or layering pleural effusion. Electronically Signed   By: Mitzi HansenLance  Furusawa-Stratton M.D.   On: 01/07/2017 00:32   [place] Abdomen Limited Ruq  Result Date: 01/08/2017 CLINICAL DATA:  Elevated liver enzymes. EXAM: US ABDOMEN LIMITED - RIGHT UPPER QUADRANT COMPARISON:  None. FINDINGS: Gallbladder: No gallstones or wall thickening visualized. No sonographic Murphy sign noted by sonographer. Common bile duct: Diameter: 5.5 mm Liver: No focal lesion identified. Liver has a heterogeneous parenchymal pattern suggesting fatty infiltration and/or hepatocellular disease. IMPRESSION: Heterogeneous hepatic parenchymal pattern suggesting fatty infiltration and/or hepatocellular disease . Exam otherwise unremarkable. Electronically Signed   By: Maisie Fushomas  Register   On: 01/08/2017 08:09    Time Spent in minutes  30   Susa RaringSINGH,Favio Moder K M.D on 01/08/2017 at 10:51 AM  Between 7am to 7pm - Pager - [phone]  After 7pm go to [email or web address] - password [email or web address]  [facility] -  Office  [phone]

## 2017-01-08 NOTE — Evaluation (Signed)
Physical Therapy Evaluation/ discharge Patient Details Name: Darren Lane MRN: [id number] DOB: 1961/04/27 Today's Date: 01/08/2017   History of Present Illness  55yo admitted after found unresponsive with cerebellar CVA. PMHx: Asthma  Clinical Impression  Pt states he does not recall events leading to admission or events on admission. Pt very pleasant without noted strength, sensation or balance deficits with activity. Pt confirms and states he feels likes his normal self and has not had any falls other than a year ago on ice. Pt has assist of friend as needed and no further therapy needs at this time as pt at his baseline level. Will sign off with pt aware and agreeable.  HR 83 sats 95% on RA    Follow Up Recommendations No PT follow up    Equipment Recommendations  None recommended by PT    Recommendations for Other Services       Precautions / Restrictions Precautions Precautions: None      Mobility  Bed Mobility Overal bed mobility: Independent                Transfers Overall transfer level: Independent                  Ambulation/Gait Ambulation/Gait assistance: Independent Ambulation Distance (Feet): 1000 Feet Assistive device: None Gait Pattern/deviations: WFL(Within Functional Limits)   Gait velocity interpretation: at or above normal speed for age/gender General Gait Details: pt completed with head turns, change of direction and change of speed without difficulty  Stairs Stairs: Yes Stairs assistance: Modified independent (Device/Increase time) Stair Management: No rails;Alternating pattern;Forwards Number of Stairs: 5    Wheelchair Mobility    Modified Rankin (Stroke Patients Only) Modified Rankin (Stroke Patients Only) Pre-Morbid Rankin Score: No symptoms Modified Rankin: No symptoms     Balance Overall balance assessment: No apparent balance deficits (not formally assessed)                                            Pertinent Vitals/Pain Pain Assessment: No/denies pain    Home Living Family/patient expects to be discharged to:: Private residence Living Arrangements: Spouse/significant other Available Help at Discharge: Friend(s);Available 24 hours/day Type of Home: Apartment Home Access: Stairs to enter Entrance Stairs-Rails: Doctor, general practice of Steps: 14 Home Layout: One level Home Equipment: None      Prior Function Level of Independence: Independent         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        Extremity/Trunk Assessment   Upper Extremity Assessment Upper Extremity Assessment: Overall WFL for tasks assessed    Lower Extremity Assessment Lower Extremity Assessment: Overall WFL for tasks assessed    Cervical / Trunk Assessment Cervical / Trunk Assessment: Normal  Communication   Communication: No difficulties  Cognition Arousal/Alertness: Awake/alert Behavior During Therapy: WFL for tasks assessed/performed Overall Cognitive Status: Within Functional Limits for tasks assessed                      General Comments      Exercises     Assessment/Plan    PT Assessment Patent does not need any further PT services  PT Problem List            PT Treatment Interventions      PT Goals (Current goals  can be found in the Care Plan section)  Acute Rehab PT Goals PT Goal Formulation: All assessment and education complete, DC therapy    Frequency     Barriers to discharge        Co-evaluation               End of Session Equipment Utilized During Treatment: Gait belt Activity Tolerance: Patient tolerated treatment well Patient left: in chair;with call bell/phone within reach Nurse Communication: Mobility status         Time: [id number] PT Time Calculation (min) (ACUTE ONLY): 18 min   Charges:   PT Evaluation $PT Eval Low Complexity: 1 Procedure     PT G Codes:         Diasha Castleman B Anilah Huck 01/08/2017, 2:19 PM  Darren MeigsMaija Lane Darren Lane, PT [phone]

## 2017-01-09 LAB — CBC
HCT: 37.4 % — ABNORMAL LOW (ref 39.0–52.0)
Hemoglobin: 12.6 g/dL — ABNORMAL LOW (ref 13.0–17.0)
MCH: 30.1 pg (ref 26.0–34.0)
MCHC: 33.7 g/dL (ref 30.0–36.0)
MCV: 89.5 fL (ref 78.0–100.0)
PLATELETS: 197 10*3/uL (ref 150–400)
RBC: 4.18 MIL/uL — ABNORMAL LOW (ref 4.22–5.81)
RDW: 12.9 % (ref 11.5–15.5)
WBC: 7.8 10*3/uL (ref 4.0–10.5)

## 2017-01-09 LAB — LIPID PANEL
CHOLESTEROL: 182 mg/dL (ref 0–200)
HDL: 39 mg/dL — ABNORMAL LOW (ref >40)
LDL Cholesterol: 96 mg/dL (ref 0–99)
Total CHOL/HDL Ratio: 4.7 RATIO
Triglycerides: 235 mg/dL — ABNORMAL HIGH (ref <150)
VLDL: 47 mg/dL — AB (ref 0–40)

## 2017-01-09 LAB — BASIC METABOLIC PANEL
Anion gap: 7 (ref 5–15)
BUN: 7 mg/dL (ref 6–20)
CALCIUM: 8.4 mg/dL — AB (ref 8.9–10.3)
CO2: 25 mmol/L (ref 22–32)
CREATININE: 0.63 mg/dL (ref 0.61–1.24)
Chloride: 107 mmol/L (ref 101–111)
GFR calc Af Amer: >60 mL/min (ref >60)
GFR calc non Af Amer: >60 mL/min (ref >60)
GLUCOSE: 118 mg/dL — AB (ref 65–99)
Potassium: 3.7 mmol/L (ref 3.5–5.1)
Sodium: 139 mmol/L (ref 135–145)

## 2017-01-09 LAB — GLUCOSE, CAPILLARY
GLUCOSE-CAPILLARY: 112 mg/dL — AB (ref 65–99)
Glucose-Capillary: 121 mg/dL — ABNORMAL HIGH (ref 65–99)

## 2017-01-09 NOTE — Discharge Summary (Signed)
AMA  Patient left AMA around 7 AM on 01/09/2017, note I'm not working at [facility] on this day but I had seen the patient yesterday, he was counseled by the nursing staff, left before physician on call could talk to him or see him.   Darren Lane on 01/09/2017 at 9:36 AM  [facility]    Last Note Below                                                                                                                                            PROGRESS NOTE                                                                                                                                                                                                             Patient Demographics:    Darren Lane, is a 56 y.o. male, DOB - 1961-08-12, WUJ:[id number]  Admit date - 01/07/2017   Admitting Physician Eduard Clos, MD  Outpatient Primary MD for the patient is No primary care provider on file.  LOS - 2  Chief Complaint  Patient presents with  . Drug Overdose       Brief Narrative  Darren Lane is a 56 y.o. male with history of asthma was brought to the ER after patient was found to be unresponsive by patient's friend last night around 11 PM. As per the patient's friend patient was doing fine when she left home at around 3 PM. When she  came back home patient was lying on the bed unresponsive with labored breathing, further workup showed small left cerebellar stroke on MRI, normal anion gap metabolic acidosis. Neuro was consulted.   Subjective:    Darren Lane today has, No headache, No chest pain, No abdominal pain - No Nausea, No new weakness tingling or numbness, No Cough - SOB.     Assessment  & Plan :    1.Encephalopathy. DefinitelyAcute left cerebellar stroke which played a role in his encephalopathy,  however concurrent toxic encephalopathy cannot be ruled out, he did respond to Narcan however rapid urine drug screen was negative, an extensive 10 point drug screen is pending, I question if he is using synthetic drug which has not picked on routine drug screens.   Girlfriend does not give a clear-cut answer, for now placed on aspirin, EEG unremarkable, neuro consulted. Clinically he is better mentation close to baseline.  2. Acute left cerebellar stroke. Full stroke protocol, on aspirin now. Pending A1c and lipid panel.  No results found for: HGBA1C Lipid Panel     Component Value Date/Time   CHOL 182 01/09/2017 0228   TRIG 235 (H) 01/09/2017 0228   HDL 39 (L) 01/09/2017 0228   CHOLHDL 4.7 01/09/2017 0228   VLDL 47 (H) 01/09/2017 0228   LDLCALC 96 01/09/2017 0228      3. Smoking. Counseled to quit  4. Normal anion gap metabolic acidosis. Bicarbonate was stable, alcohol screen was negative, monitor with supportive care.  5. ARF. Resolved with hydration.  6. Elevated LFTs. Suspicious for alcohol abuse but he denies, negative acute hepatitis panel, ultrasound shows fatty liver for which she needs to follow with GI outpatient, trend is stable, placed on lactulose for borderline ammonia levels.   7. Asthma. Stable no wheezing, supportive care with oxygen and nebulizer treatments as needed.  8. CAP. Follow on empiric antibiotics, not short of breath, and she afebrile, mild cough, leukocytosis has resolved.  9. Mild elevation in troponin. Trend is flat, this is non-ACS, no chest pain, EKG nonacute, on aspirin, check echo to evaluate wall motion and EF.    Family Communication  :  Girlfriend  Code Status : Full  Diet :   nothing by mouth till he passes swallow screen  Disposition Plan  :  Stay inpatient  Consults  : Neuro  Procedures  :    CT and MRI brain. Confirming small left cerebellar stroke.  EEG. Nonspecific  Right upper quadrant ultrasound. Fatty  liver  Echocardiogram  Carotid Duplex  DVT Prophylaxis  :    Heparin   Lab Results  Component Value Date   PLT 197 01/09/2017    Inpatient Medications  Scheduled Meds: . aspirin  81 mg Oral Daily  . azithromycin  500 mg Intravenous Q24H  . cefTRIAXone (ROCEPHIN)  IV  1 g Intravenous Q24H  . heparin subcutaneous  5,000 Units Subcutaneous Q8H  . lactulose  10 g Oral TID  . mouth rinse  15 mL Mouth Rinse BID   Continuous Infusions:  PRN Meds:.acetaminophen **OR** acetaminophen, albuterol, ondansetron **OR** ondansetron (ZOFRAN) IV  Antibiotics  :    Anti-infectives    Start     Dose/Rate Route Frequency Ordered Stop   01/07/17 0600  cefTRIAXone (ROCEPHIN) 1 g in dextrose 5 % 50 mL IVPB     1 g 100 mL/hr over 30 Minutes Intravenous Every 24 hours 01/07/17 0535     01/07/17 0545  azithromycin (ZITHROMAX) 500 mg in dextrose 5 %  250 mL IVPB     500 mg 250 mL/hr over 60 Minutes Intravenous Every 24 hours 01/07/17 0532     01/07/17 0130  vancomycin (VANCOCIN) IVPB 1000 mg/200 mL premix     1,000 mg 200 mL/hr over 60 Minutes Intravenous  Once 01/07/17 0115 01/07/17 0725   01/07/17 0130  piperacillin-tazobactam (ZOSYN) IVPB 3.375 g     3.375 g 100 mL/hr over 30 Minutes Intravenous  Once 01/07/17 0115 01/07/17 0406         Objective:   Vitals:   01/08/17 1616 01/08/17 1925 01/08/17 2332 01/09/17 0446  BP: (!) 139/110 (!) 144/94 116/73 133/85  Pulse: 69 83    Resp: 16 (!) 22 20 19   Temp: 98.4 F (36.9 C) 99.1 F (37.3 C) 98.3 F (36.8 C) 98.1 F (36.7 C)  TempSrc: Oral Oral Oral Oral  SpO2: 96% 94% 95%   Weight:    86.6 kg (191 lb)  Height:        Wt Readings from Last 3 Encounters:  01/09/17 86.6 kg (191 lb)     Intake/Output Summary (Last 24 hours) at 01/09/17 0937 Last data filed at 01/09/17 0400  Gross per 24 hour  Intake           2022.5 ml  Output                0 ml  Net           2022.5 ml     Physical Exam  Awake alert, oriented 3 today,  No new F.N deficits, Normal affect [place].AT,PERRAL Supple Neck,No JVD, No cervical lymphadenopathy appriciated.  Symmetrical Chest wall movement, Good air movement bilaterally, CTAB RRR,No Gallops,Rubs or new Murmurs, No Parasternal Heave +ve B.Sounds, Abd Soft, No tenderness, No organomegaly appriciated, No rebound - guarding or rigidity. No Cyanosis, Clubbing or edema, No new Rash or bruise       Data Review:    CBC  Recent Labs Lab 01/07/17 0004 01/07/17 0013 01/07/17 0556 01/08/17 0313 01/09/17 0228  WBC 23.0*  --  15.3* 8.8 7.8  HGB 15.3 16.0 14.3 11.8* 12.6*  HCT 45.9 47.0 43.2 35.8* 37.4*  PLT 333  --  333 227 197  MCV 91.6  --  90.8 91.6 89.5  MCH 30.5  --  30.0 30.2 30.1  MCHC 33.3  --  33.1 33.0 33.7  RDW 13.2  --  12.9 13.3 12.9  LYMPHSABS 0.9  --  1.1  --   --   MONOABS 2.3*  --  0.5  --   --   EOSABS 0.0  --  0.0  --   --   BASOSABS 0.0  --  0.0  --   --     Chemistries   Recent Labs Lab 01/07/17 0004 01/07/17 0013 01/07/17 0556 01/07/17 0804 01/07/17 1406 01/09/17 0228  NA 135 136 137 138 137 139  K 4.5 4.5 4.9 4.9 4.4 3.7  CL 99* 101 104 106 106 107  CO2 21*  --  22 23 22 25   GLUCOSE 200* 202* 110* 115* 95 118*  BUN 17 20 18 19 18 7   CREATININE 1.48* 1.60* 1.36* 1.19 1.02 0.63  CALCIUM 9.1  --  8.1* 8.2* 8.0* 8.4*  AST 43*  --  57*  --  81*  --   ALT 41  --  47  --  49  --   ALKPHOS 42  --  35*  --  33*  --  BILITOT 0.3  --  1.0  --  0.7  --    ------------------------------------------------------------------------------------------------------------------  Recent Labs  01/09/17 0228  CHOL 182  HDL 39*  LDLCALC 96  TRIG 161*  CHOLHDL 4.7    No results found for: HGBA1C ------------------------------------------------------------------------------------------------------------------ No results for input(s): TSH, T4TOTAL, T3FREE, THYROIDAB in the last 72 hours.  Invalid input(s):  FREET3 ------------------------------------------------------------------------------------------------------------------ No results for input(s): VITAMINB12, FOLATE, FERRITIN, TIBC, IRON, RETICCTPCT in the last 72 hours.  Coagulation profile No results for input(s): INR, PROTIME in the last 168 hours.  No results for input(s): DDIMER in the last 72 hours.  Cardiac Enzymes  Recent Labs Lab 01/07/17 0804 01/07/17 1137 01/07/17 1406  TROPONINI 0.43* 0.30* 0.34*   ------------------------------------------------------------------------------------------------------------------    Component Value Date/Time   BNP 69.7 01/07/2017 0149    Micro Results Recent Results (from the past 240 hour(s))  Blood culture (routine x 2)     Status: None (Preliminary result)   Collection Time: 01/07/17  1:49 AM  Result Value Ref Range Status   Specimen Description BLOOD RIGHT HAND  Final   Special Requests BOTTLES DRAWN AEROBIC AND ANAEROBIC 5CC EA  Final   Culture NO GROWTH 1 DAY  Final   Report Status PENDING  Incomplete  Blood culture (routine x 2)     Status: None (Preliminary result)   Collection Time: 01/07/17  1:49 AM  Result Value Ref Range Status   Specimen Description BLOOD LEFT HAND  Final   Special Requests BOTTLES DRAWN AEROBIC AND ANAEROBIC 5CC EA  Final   Culture NO GROWTH 1 DAY  Final   Report Status PENDING  Incomplete  MRSA PCR Screening     Status: None   Collection Time: 01/07/17 10:45 PM  Result Value Ref Range Status   MRSA by PCR NEGATIVE NEGATIVE Final    Comment:        The GeneXpert MRSA Assay (FDA approved for NASAL specimens only), is one component of a comprehensive MRSA colonization surveillance program. It is not intended to diagnose MRSA infection nor to guide or monitor treatment for MRSA infections.     Radiology Reports Ct Angio Head W Or Wo Contrast  Result Date: 01/08/2017 CLINICAL DATA:  Found unresponsive. Slurred speech and left-sided  weakness. Acute left cerebellar infarct on MRI. EXAM: CT ANGIOGRAPHY HEAD AND NECK TECHNIQUE: Multidetector CT imaging of the head and neck was performed using the standard protocol during bolus administration of intravenous contrast. Multiplanar CT image reconstructions and MIPs were obtained to evaluate the vascular anatomy. Carotid stenosis measurements (when applicable) are obtained utilizing NASCET criteria, using the distal internal carotid diameter as the denominator. CONTRAST:  50 mL Isovue 370 COMPARISON:  Head MRI/ MRA and CT 01/07/2017 FINDINGS: CTA NECK FINDINGS Aortic arch: 3 vessel aortic arch. Brachiocephalic and subclavian arteries are widely patent. Right carotid system: Patent without evidence of stenosis or dissection. Left carotid system: Patent without evidence of stenosis or dissection. Mildly tortuous distal cervical ICA. Vertebral arteries: Patent without evidence of stenosis or dissection. Codominant vertebral arteries. Skeleton: Mild cervical facet arthrosis and minimal lower cervical spondylosis. Other neck: Moderate mucosal thickening in the paranasal sinuses. Fluid/ secretions in the left sphenoid and left greater than right maxillary sinuses. Dental disease including prominent left mandibular molar periapical lucency. Upper chest: Moderate centrilobular emphysema greater in the right lung apex. Review of the MIP images confirms the above findings CTA HEAD FINDINGS Anterior circulation: The internal carotid arteries are widely patent from skullbase to carotid termini.  ACAs and MCAs are patent with mild branch vessel irregularity but no evidence of major branch occlusion or significant proximal stenosis. No aneurysm. Posterior circulation: Intracranial vertebral arteries are widely patent to the basilar. There is a small left PICA. Left AICA is dominant. Patent SCA is are seen bilaterally. The basilar artery is widely patent. There are patent posterior communicating arteries bilaterally  with a severely hypoplastic left P1 segment. PCAs are patent with mild branch vessel irregularity but no significant proximal stenosis. No aneurysm. Venous sinuses: Patent. Anatomic variants: Hypoplastic left P1. Delayed phase: No abnormal enhancement. Review of the MIP images confirms the above findings IMPRESSION: 1. Mild intracranial branch vessel atherosclerotic change without major vessel occlusion or significant proximal stenosis. 2. Widely patent cervical carotid and vertebral arteries. 3. Sinusitis. Electronically Signed   By: Sebastian Ache Lane.   On: 01/08/2017 12:37   Ct Head Wo Contrast  Result Date: 01/07/2017 CLINICAL DATA:  56 y/o M; altered mental status following possible overdose. All EXAM: CT HEAD WITHOUT CONTRAST CT CERVICAL SPINE WITHOUT CONTRAST TECHNIQUE: Multidetector CT imaging of the head and cervical spine was performed following the standard protocol without intravenous contrast. Multiplanar CT image reconstructions of the cervical spine were also generated. COMPARISON:  None. FINDINGS: CT HEAD FINDINGS Brain: No evidence of acute infarction, hemorrhage, hydrocephalus, extra-axial collection or mass lesion/mass effect. Small lucencies in left lentiform nucleus at insula may represent prominent perivascular spaces or age-indeterminate lacunar infarcts. Vascular: No hyperdense vessel or unexpected calcification. Skull: Normal. Negative for fracture or focal lesion. Sinuses/Orbits: Partial opacification of paranasal sinuses with aerosolized secretions and fluid levels. Normally pneumatized mastoid air cells. Orbits are unremarkable. Other: Left-sided nasal airway to urinating in the hypopharynx. CT CERVICAL SPINE FINDINGS Alignment: Straightening of cervical lordosis without listhesis or dislocation. Skull base and vertebrae: No acute fracture. No primary bone lesion or focal pathologic process. Soft tissues and spinal canal: No prevertebral fluid or swelling. No visible canal hematoma.  Disc levels: Mild cervical spondylosis with minimal disc space narrowing and marginal osteophytosis at the C5 through C7 levels. Right-sided upper cervical facet degenerative changes. No high-grade bony canal stenosis. Upper chest: Emphysema of lung apices. Other: Debris within the oral and hypopharynx. IMPRESSION: 1. No acute intracranial abnormality identified. 2. Small lucencies in left lentiform nucleus and insula may represent prominent perivascular spaces or age-indeterminate lacunar infarcts. 3. Opacification of paranasal sinuses with fluid levels possibly due to the presence of nasal tube. 4. No acute fracture or dislocation of cervical spine. 5. Lung apex emphysema. Electronically Signed   By: Mitzi Hansen Lane.   On: 01/07/2017 01:14   Ct Angio Neck W Or Wo Contrast  Result Date: 01/08/2017 CLINICAL DATA:  Found unresponsive. Slurred speech and left-sided weakness. Acute left cerebellar infarct on MRI. EXAM: CT ANGIOGRAPHY HEAD AND NECK TECHNIQUE: Multidetector CT imaging of the head and neck was performed using the standard protocol during bolus administration of intravenous contrast. Multiplanar CT image reconstructions and MIPs were obtained to evaluate the vascular anatomy. Carotid stenosis measurements (when applicable) are obtained utilizing NASCET criteria, using the distal internal carotid diameter as the denominator. CONTRAST:  50 mL Isovue 370 COMPARISON:  Head MRI/ MRA and CT 01/07/2017 FINDINGS: CTA NECK FINDINGS Aortic arch: 3 vessel aortic arch. Brachiocephalic and subclavian arteries are widely patent. Right carotid system: Patent without evidence of stenosis or dissection. Left carotid system: Patent without evidence of stenosis or dissection. Mildly tortuous distal cervical ICA. Vertebral arteries: Patent without evidence of stenosis or dissection.  Codominant vertebral arteries. Skeleton: Mild cervical facet arthrosis and minimal lower cervical spondylosis. Other neck:  Moderate mucosal thickening in the paranasal sinuses. Fluid/ secretions in the left sphenoid and left greater than right maxillary sinuses. Dental disease including prominent left mandibular molar periapical lucency. Upper chest: Moderate centrilobular emphysema greater in the right lung apex. Review of the MIP images confirms the above findings CTA HEAD FINDINGS Anterior circulation: The internal carotid arteries are widely patent from skullbase to carotid termini. ACAs and MCAs are patent with mild branch vessel irregularity but no evidence of major branch occlusion or significant proximal stenosis. No aneurysm. Posterior circulation: Intracranial vertebral arteries are widely patent to the basilar. There is a small left PICA. Left AICA is dominant. Patent SCA is are seen bilaterally. The basilar artery is widely patent. There are patent posterior communicating arteries bilaterally with a severely hypoplastic left P1 segment. PCAs are patent with mild branch vessel irregularity but no significant proximal stenosis. No aneurysm. Venous sinuses: Patent. Anatomic variants: Hypoplastic left P1. Delayed phase: No abnormal enhancement. Review of the MIP images confirms the above findings IMPRESSION: 1. Mild intracranial branch vessel atherosclerotic change without major vessel occlusion or significant proximal stenosis. 2. Widely patent cervical carotid and vertebral arteries. 3. Sinusitis. Electronically Signed   By: Sebastian Ache Lane.   On: 01/08/2017 12:37   Ct Cervical Spine Wo Contrast  Result Date: 01/07/2017 CLINICAL DATA:  56 y/o M; altered mental status following possible overdose. All EXAM: CT HEAD WITHOUT CONTRAST CT CERVICAL SPINE WITHOUT CONTRAST TECHNIQUE: Multidetector CT imaging of the head and cervical spine was performed following the standard protocol without intravenous contrast. Multiplanar CT image reconstructions of the cervical spine were also generated. COMPARISON:  None. FINDINGS: CT HEAD  FINDINGS Brain: No evidence of acute infarction, hemorrhage, hydrocephalus, extra-axial collection or mass lesion/mass effect. Small lucencies in left lentiform nucleus at insula may represent prominent perivascular spaces or age-indeterminate lacunar infarcts. Vascular: No hyperdense vessel or unexpected calcification. Skull: Normal. Negative for fracture or focal lesion. Sinuses/Orbits: Partial opacification of paranasal sinuses with aerosolized secretions and fluid levels. Normally pneumatized mastoid air cells. Orbits are unremarkable. Other: Left-sided nasal airway to urinating in the hypopharynx. CT CERVICAL SPINE FINDINGS Alignment: Straightening of cervical lordosis without listhesis or dislocation. Skull base and vertebrae: No acute fracture. No primary bone lesion or focal pathologic process. Soft tissues and spinal canal: No prevertebral fluid or swelling. No visible canal hematoma. Disc levels: Mild cervical spondylosis with minimal disc space narrowing and marginal osteophytosis at the C5 through C7 levels. Right-sided upper cervical facet degenerative changes. No high-grade bony canal stenosis. Upper chest: Emphysema of lung apices. Other: Debris within the oral and hypopharynx. IMPRESSION: 1. No acute intracranial abnormality identified. 2. Small lucencies in left lentiform nucleus and insula may represent prominent perivascular spaces or age-indeterminate lacunar infarcts. 3. Opacification of paranasal sinuses with fluid levels possibly due to the presence of nasal tube. 4. No acute fracture or dislocation of cervical spine. 5. Lung apex emphysema. Electronically Signed   By: Mitzi Hansen Lane.   On: 01/07/2017 01:14   Mr Angiogram Head Wo Contrast  Result Date: 01/07/2017 CLINICAL DATA:  56 year old male found unresponsive. Left cerebellar infarct. Subsequent encounter. EXAM: MRA HEAD WITHOUT CONTRAST TECHNIQUE: Angiographic images of the Circle of Willis were obtained using MRA  technique without intravenous contrast. COMPARISON:  01/07/2017 brain MR and head CT. FINDINGS: Exam is motion degraded. This limits evaluation for detection of an aneurysm or adequately grading stenosis. Anterior  circulation without large size vessel significant stenosis or occlusion. Mild ectasia distal cervical segment left internal carotid artery. Fetal contribution to posterior cerebral artery with mild prominence origin of the left posterior communicating artery. Middle cerebral artery branch vessel irregularity and narrowing bilaterally, more notable on the left. No significant stenosis of the distal vertebral arteries or basilar artery. Small left posterior inferior cerebellar artery. Moderately large left anterior inferior cerebellar artery with areas of mild narrowing. Poor delineation of the right posterior inferior cerebellar artery. Moderately large right anterior inferior cerebellar artery. Mild to moderate narrowing mid to distal aspect left superior cerebellar artery. IMPRESSION: Exam is motion degraded. No significant stenosis distal vertebral arteries or basilar artery. Mild to moderate narrowing mid to distal aspect left superior cerebellar artery. Poor delineation of the right posterior inferior cerebellar artery. Mild narrowing of portions of the left anterior inferior cerebellar artery. Anterior circulation without large size vessel significant stenosis or occlusion. Narrowing and irregularity of middle cerebral artery branches greater on the left. Fetal contribution to posterior cerebral artery bilaterally. Electronically Signed   By: Lacy Duverney Lane.   On: 01/07/2017 08:20   Mr Brain Wo Contrast  Result Date: 01/07/2017 CLINICAL DATA:  Found unresponsive in bit head.  Labored breathing. EXAM: MRI HEAD WITHOUT CONTRAST TECHNIQUE: Multiplanar, multiecho pulse sequences of the brain and surrounding structures were obtained without intravenous contrast. COMPARISON:  CT same day FINDINGS:  Brain: Diffusion imaging shows a 5 mm acute infarction within the left cerebellum. No other acute infarction. Brainstem and cerebellum are otherwise normal. Cerebral hemispheres are normal. No mass lesion, hemorrhage, hydrocephalus or extra-axial collection. No pituitary mass. Vascular: Major vessels at the base of the brain show flow. Skull and upper cervical spine: Negative Sinuses/Orbits: Inflammatory changes throughout the paranasal sinuses. Mastoids are clear. Orbits are negative. 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 The study is otherwise normal except for a inflammatory changes of the paranasal sinuses. Electronically Signed   By: Paulina Fusi Lane.   On: 01/07/2017 07:17   Dg Chest Port 1 View  Result Date: 01/07/2017 CLINICAL DATA:  56 y/o  M; found unresponsive.  History of drug use. EXAM: PORTABLE CHEST 1 VIEW COMPARISON:  None. FINDINGS: Asymmetric hazy opacification of the left lung may represent edema or layering pleural effusion. Low lung volumes. Cardiac silhouette with no normal limits given projection and technique. Bones are unremarkable. IMPRESSION: Asymmetric hazy opacification of left lung may represent edema or layering pleural effusion. Electronically Signed   By: Mitzi Hansen Lane.   On: 01/07/2017 00:32   US Abdomen Limited Ruq  Result Date: 01/08/2017 CLINICAL DATA:  Elevated liver enzymes. EXAM: US ABDOMEN LIMITED - RIGHT UPPER QUADRANT COMPARISON:  None. FINDINGS: Gallbladder: No gallstones or wall thickening visualized. No sonographic Murphy sign noted by sonographer. Common bile duct: Diameter: 5.5 mm Liver: No focal lesion identified. Liver has a heterogeneous parenchymal pattern suggesting fatty infiltration and/or hepatocellular disease. IMPRESSION: Heterogeneous hepatic parenchymal pattern suggesting fatty infiltration and/or hepatocellular disease . Exam otherwise unremarkable. Electronically Signed   By: Maisie Fus   Register   On: 01/08/2017 08:09    Time Spent in minutes  30   Susa Raring K Lane on 01/09/2017 at 9:37 AM  Between 7am to 7pm - Pager - [phone]  After 7pm go to [email or web address] - password [facility]  [facility] -  Office  [phone]

## 2017-01-09 NOTE — Progress Notes (Signed)
Patient pulled his IV out by accident, but refused to have a new one placed by the nurse.  Hospitalist notified.

## 2017-01-09 NOTE — Progress Notes (Signed)
Patient just left AMA. Hospitalist paged.

## 2017-01-10 LAB — HEMOGLOBIN A1C
Hgb A1c MFr Bld: 6.1 % — ABNORMAL HIGH (ref 4.8–5.6)
MEAN PLASMA GLUCOSE: 128 mg/dL

## 2017-01-12 LAB — CULTURE, BLOOD (ROUTINE X 2)
CULTURE: NO GROWTH
Culture: NO GROWTH

## 2017-01-15 LAB — OPIATES,MS,WB/SP RFX
6-Acetylmorphine: NEGATIVE
Codeine: NEGATIVE ng/mL
Dihydrocodeine: NEGATIVE ng/mL
HYDROCODONE: NEGATIVE ng/mL
Hydromorphone: NEGATIVE ng/mL
Morphine: NEGATIVE ng/mL
OPIATE CONFIRMATION: NEGATIVE

## 2017-01-15 LAB — DRUG SCREEN 10 W/CONF, SERUM
Amphetamines, IA: NEGATIVE ng/mL
BARBITURATES, IA: NEGATIVE ug/mL
Benzodiazepines, IA: NEGATIVE ng/mL
Cocaine & Metabolite, IA: NEGATIVE ng/mL
METHADONE, IA: NEGATIVE ng/mL
Opiates, IA: NEGATIVE ng/mL
Oxycodones, IA: POSITIVE ng/mL
PROPOXYPHENE, IA: NEGATIVE ng/mL
Phencyclidine, IA: NEGATIVE ng/mL
THC(Marijuana) Metabolite, IA: NEGATIVE ng/mL

## 2017-01-15 LAB — OXYCODONES,MS,WB/SP RFX
OXYCOCONE: 14.8 ng/mL
OXYCODONES CONFIRMATION: POSITIVE
OXYMORPHONE: NEGATIVE ng/mL

## 2017-10-23 ENCOUNTER — Emergency Department (HOSPITAL_COMMUNITY)
Admission: EM | Admit: 2017-10-23 | Discharge: 2017-10-24 | Disposition: A | Discharge status: Home / Self Care | Payer: BLUE CROSS/BLUE SHIELD | Source: Home / Self Care | Attending: Emergency Medicine | Admitting: Emergency Medicine

## 2017-10-23 ENCOUNTER — Encounter (HOSPITAL_COMMUNITY): Payer: Self-pay | Admitting: Emergency Medicine

## 2017-10-23 DIAGNOSIS — F17210 Nicotine dependence, cigarettes, uncomplicated: Secondary | ICD-10-CM | POA: Diagnosis not present

## 2017-10-23 DIAGNOSIS — Z7982 Long term (current) use of aspirin: Secondary | ICD-10-CM

## 2017-10-23 DIAGNOSIS — J45909 Unspecified asthma, uncomplicated: Secondary | ICD-10-CM | POA: Insufficient documentation

## 2017-10-23 DIAGNOSIS — F17200 Nicotine dependence, unspecified, uncomplicated: Secondary | ICD-10-CM

## 2017-10-23 DIAGNOSIS — H3321 Serous retinal detachment, right eye: Secondary | ICD-10-CM | POA: Insufficient documentation

## 2017-10-23 DIAGNOSIS — H43391 Other vitreous opacities, right eye: Secondary | ICD-10-CM

## 2017-10-23 IMAGING — CT CT HEAD W/O CM
6 of 9 series · 21 of 47 positions shown, 23 images · non-contrast
Comparison: None.

CLINICAL DATA: 55 y/o M; altered mental status following possible
overdose. All

EXAM:
CT HEAD WITHOUT CONTRAST
CT CERVICAL SPINE WITHOUT CONTRAST
TECHNIQUE: Multidetector CT imaging of the head and cervical spine was
performed following the standard protocol without intravenous
contrast. Multiplanar CT image reconstructions of the cervical spine
were also generated.

[Series 202: head w/o bone, idose (1) · axial · non-contrast · 0.43mm/px · z∈[+258,+290]mm · 2 of 68 slices shown]
[im 14/68  bone]
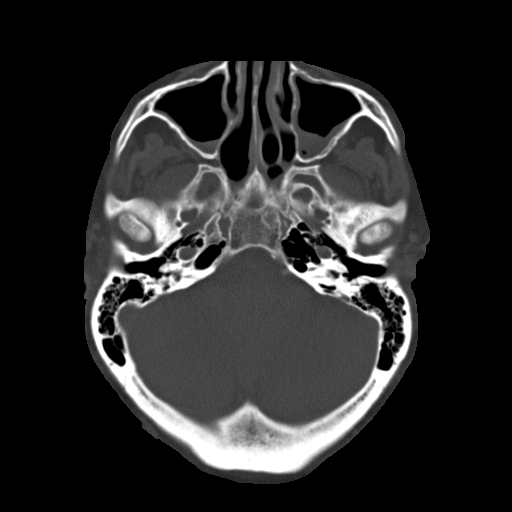
[im 27/68  bone]
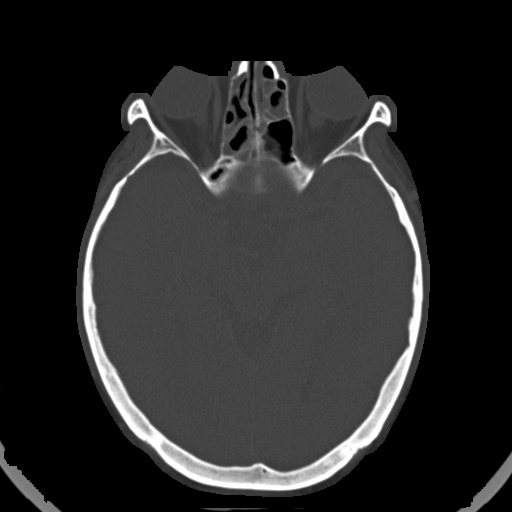

[Series 205: head w/o 2mm, idose (1) · axial · non-contrast · 0.53mm/px · z∈[+323,+417]mm · 5 of 82 slices shown, 7 images]
[im 14/82  brain]
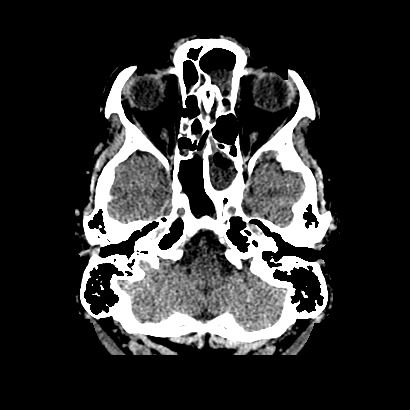
[im 14/82  bone]
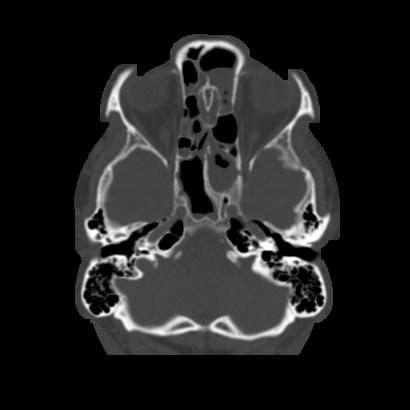
[im 28/82  brain]
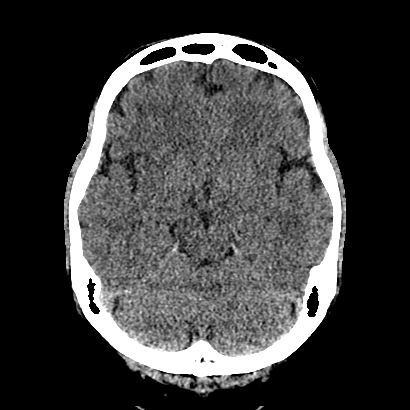
[im 41/82  brain]
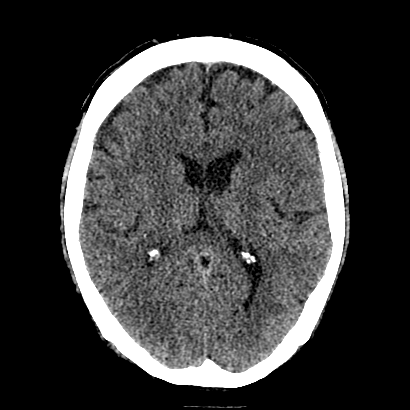
[im 55/82  brain]
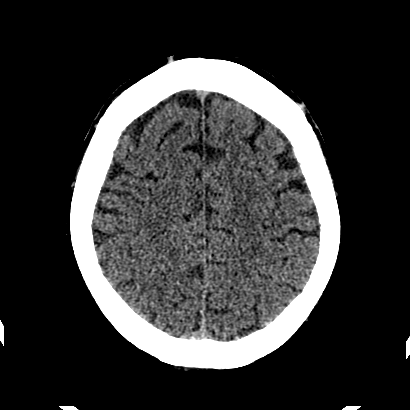
[im 68/82  brain]
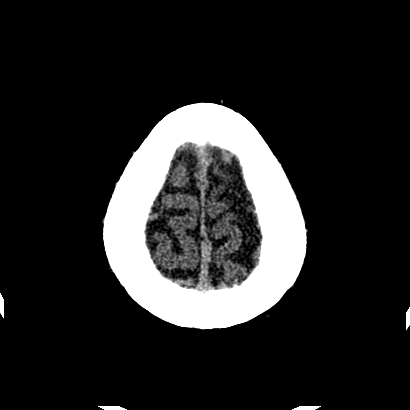
[im 68/82  bone]
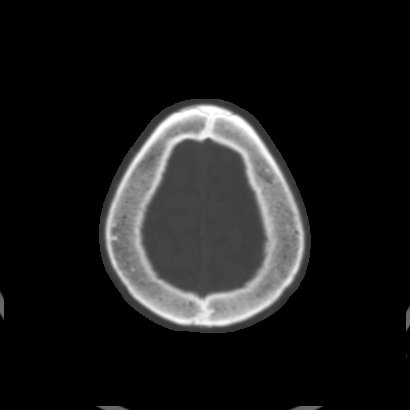

[Series 302: soft tissue, idose (2) · axial · 0.28mm/px · z∈[+117,+231]mm · 5 of 87 slices shown]
[im 15/87  brain]
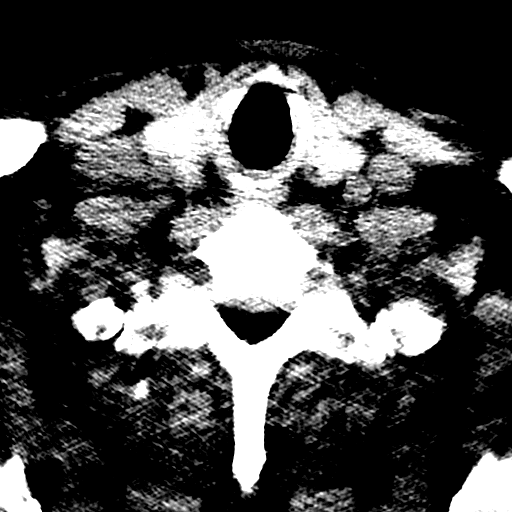
[im 29/87  brain]
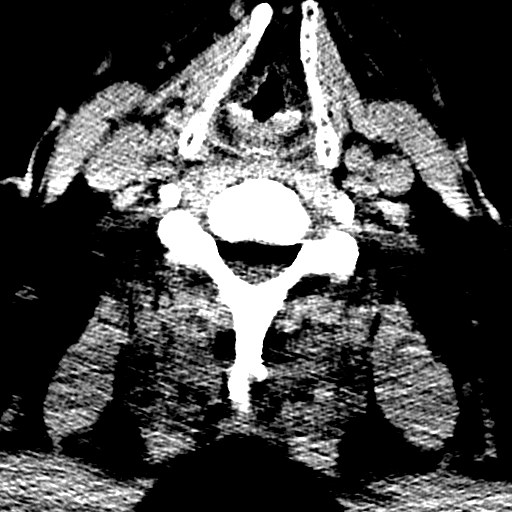
[im 44/87  brain]
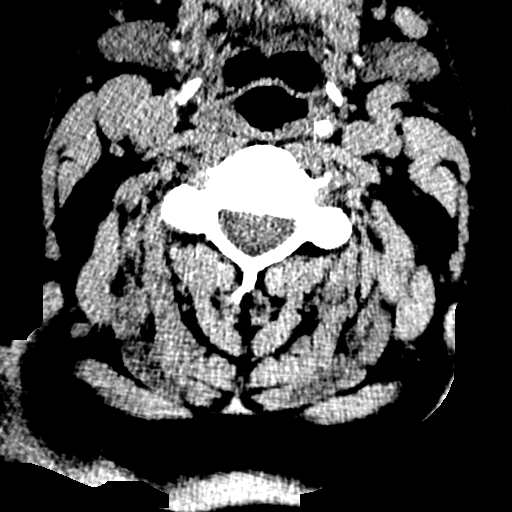
[im 58/87  brain]
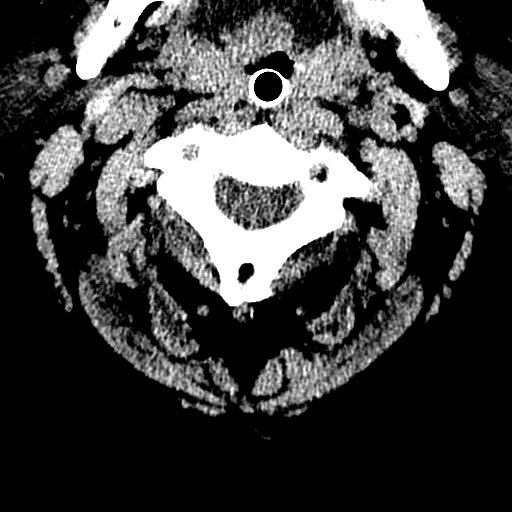
[im 72/87  brain]
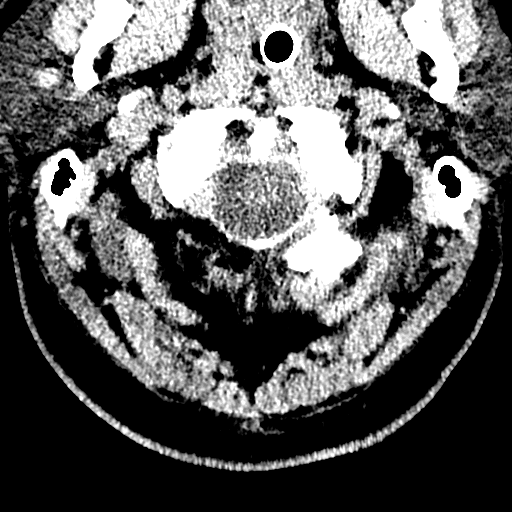

[Series 305: sagittal, idose (2) · sagittal · 0.28mm/px · 2 of 71 slices shown]
[im 24/71  brain]
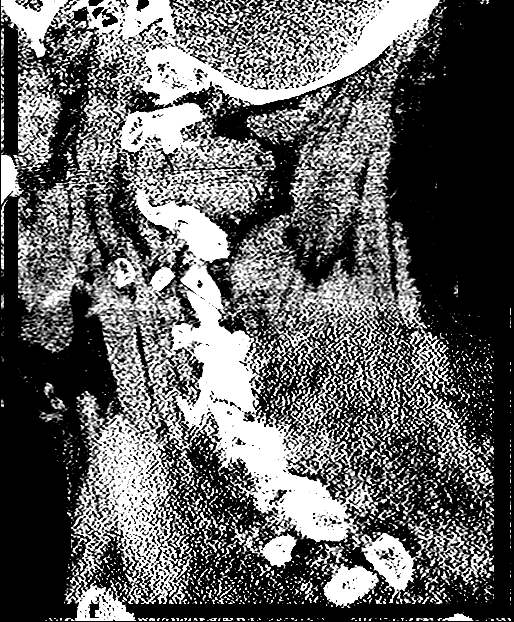
[im 47/71  brain]
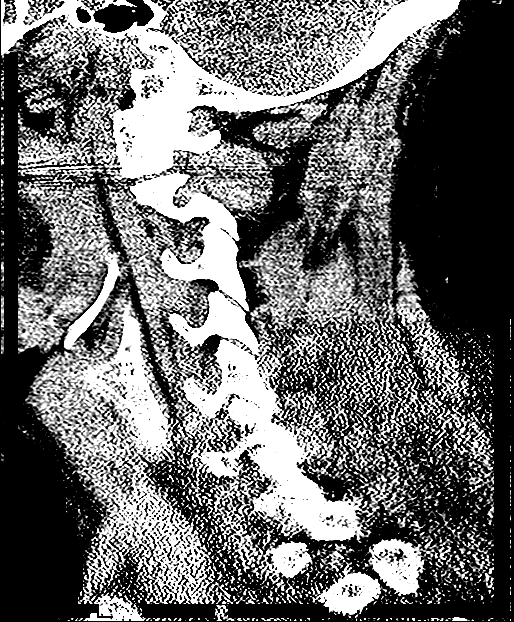

[Series 307: coronal, idose (2) · coronal · 0.32mm/px · 2 of 53 slices shown]
[im 18/53  brain]
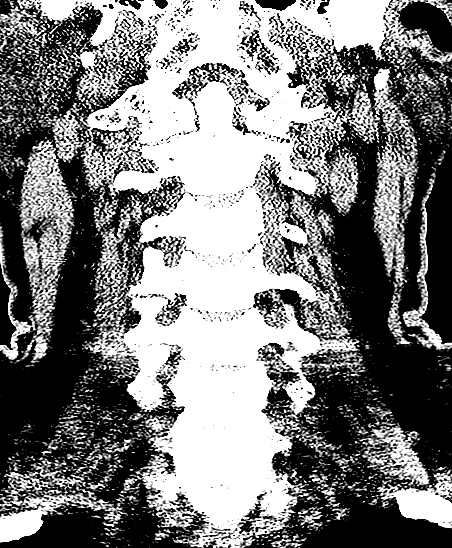
[im 35/53  brain]
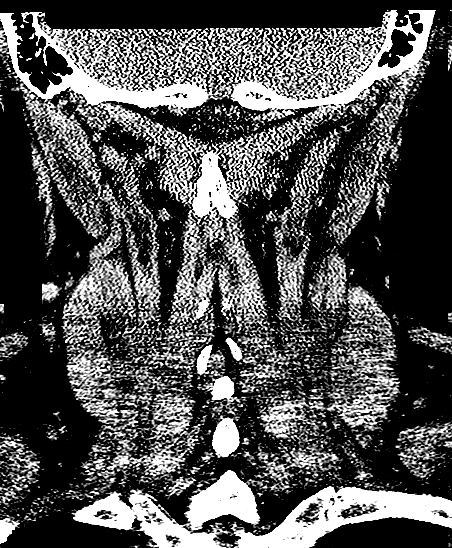

[Series 308: orthogonals, idose (2) · axial · 0.28mm/px · z∈[+109,+219]mm · 5 of 85 slices shown]
[im 15/85  brain]
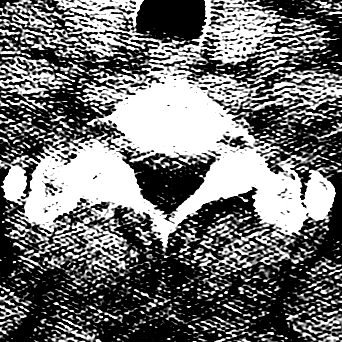
[im 29/85  brain]
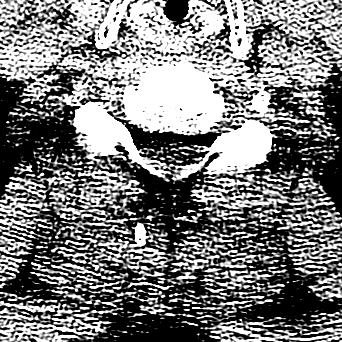
[im 43/85  brain]
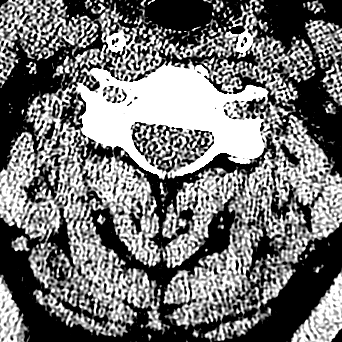
[im 57/85  brain]
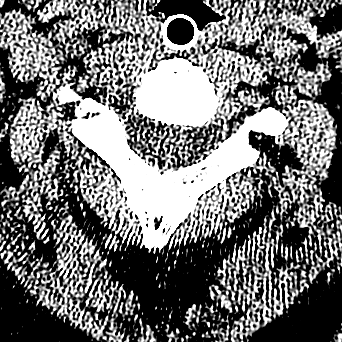
[im 71/85  brain]
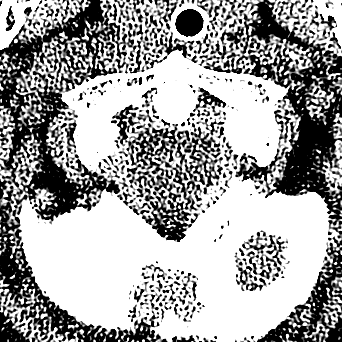

[21 of 47 positions shown; findings below may reference images not displayed]

FINDINGS: CT HEAD FINDINGS

Brain: No evidence of acute infarction, hemorrhage, hydrocephalus,
extra-axial collection or mass lesion/mass effect. Small lucencies
in left lentiform nucleus at insula may represent prominent
perivascular spaces or age-indeterminate lacunar infarcts.

Vascular: No hyperdense vessel or unexpected calcification.

Skull: Normal. Negative for fracture or focal lesion.

Sinuses/Orbits: Partial opacification of paranasal sinuses with
aerosolized secretions and fluid levels. Normally pneumatized
mastoid air cells. Orbits are unremarkable.

Other: Left-sided nasal airway to urinating in the hypopharynx.

CT CERVICAL SPINE FINDINGS

Alignment: Straightening of cervical lordosis without listhesis or
dislocation.

Skull base and vertebrae: No acute fracture. No primary bone lesion
or focal pathologic process.

Soft tissues and spinal canal: No prevertebral fluid or swelling. No
visible canal hematoma.

Disc levels: Mild cervical spondylosis with minimal disc space
narrowing and marginal osteophytosis at the C5 through C7 levels.
Right-sided upper cervical facet degenerative changes. No high-grade
bony canal stenosis.

Upper chest: Emphysema of lung apices.

Other: Debris within the oral and hypopharynx.
IMPRESSION: 1. No acute intracranial abnormality identified.
2. Small lucencies in left lentiform nucleus and insula may
represent prominent perivascular spaces or age-indeterminate lacunar
infarcts.
3. Opacification of paranasal sinuses with fluid levels possibly due
to the presence of nasal tube.
4. No acute fracture or dislocation of cervical spine.
5. Lung apex emphysema.

By: Linda Edison Sobocan M.D.

## 2017-10-23 MED ORDER — CYCLOPENTOLATE HCL 1 % OP SOLN
2.0000 [drp] | Freq: Once | OPHTHALMIC | Status: AC
  Administered 2017-10-24: 2 [drp] via OPHTHALMIC
  Filled 2017-10-23: qty 2

## 2017-10-23 NOTE — ED Triage Notes (Signed)
Pt states a few days ago his right eye started to bother him and he thought he just had "floaters" in it then 2 days ago he noticed he was having shadowing in the outside corner of his right eye  Pt states since then the shadowing is in the corner of the right eye and at the bottom of his right eye   Denies pain

## 2017-10-24 ENCOUNTER — Inpatient Hospital Stay (HOSPITAL_COMMUNITY): Payer: BLUE CROSS/BLUE SHIELD | Admitting: Anesthesiology

## 2017-10-24 ENCOUNTER — Ambulatory Visit (HOSPITAL_COMMUNITY)
Admission: RE | Admit: 2017-10-24 | Discharge: 2017-10-24 | Disposition: A | Discharge status: Home / Self Care | Payer: BLUE CROSS/BLUE SHIELD | Source: Ambulatory Visit | Attending: Ophthalmology | Admitting: Ophthalmology

## 2017-10-24 ENCOUNTER — Emergency Department (HOSPITAL_COMMUNITY): Payer: BLUE CROSS/BLUE SHIELD

## 2017-10-24 ENCOUNTER — Encounter (HOSPITAL_COMMUNITY)
Admission: RE | Disposition: A | Discharge status: Home / Self Care | Payer: Self-pay | Source: Ambulatory Visit | Attending: Ophthalmology

## 2017-10-24 ENCOUNTER — Encounter (HOSPITAL_COMMUNITY): Payer: Self-pay

## 2017-10-24 ENCOUNTER — Ambulatory Visit: Payer: Self-pay | Admitting: Ophthalmology

## 2017-10-24 HISTORY — PX: GAS INSERTION: SHX5336

## 2017-10-24 HISTORY — PX: REPAIR OF COMPLEX TRACTION RETINAL DETACHMENT: SHX6217

## 2017-10-24 HISTORY — PX: PARS PLANA VITRECTOMY: SHX2166

## 2017-10-24 LAB — CBC
HEMATOCRIT: 44.1 % (ref 39.0–52.0)
Hemoglobin: 15.1 g/dL (ref 13.0–17.0)
MCH: 30.8 pg (ref 26.0–34.0)
MCHC: 34.2 g/dL (ref 30.0–36.0)
MCV: 90 fL (ref 78.0–100.0)
PLATELETS: 298 10*3/uL (ref 150–400)
RBC: 4.9 MIL/uL (ref 4.22–5.81)
RDW: 13 % (ref 11.5–15.5)
WBC: 8.7 10*3/uL (ref 4.0–10.5)

## 2017-10-24 SURGERY — REPAIR, RETINAL DETACHMENT, COMPLEX
Anesthesia: General | Site: Eye | Laterality: Right

## 2017-10-24 MED ORDER — PROPOFOL 10 MG/ML IV BOLUS
INTRAVENOUS | Status: DC | PRN
  Administered 2017-10-24: 20 mg via INTRAVENOUS

## 2017-10-24 MED ORDER — FENTANYL CITRATE (PF) 100 MCG/2ML IJ SOLN: 25.0000 ug | INTRAMUSCULAR | Status: DC | PRN

## 2017-10-24 MED ORDER — TETRACAINE HCL 0.5 % OP SOLN
OPHTHALMIC | Status: AC
  Filled 2017-10-24: qty 4

## 2017-10-24 MED ORDER — LIDOCAINE HCL (PF) 1 % IJ SOLN
INTRAMUSCULAR | Status: AC
  Filled 2017-10-24: qty 30

## 2017-10-24 MED ORDER — DEXAMETHASONE SODIUM PHOSPHATE 10 MG/ML IJ SOLN
INTRAMUSCULAR | Status: DC | PRN
  Administered 2017-10-24: 10 mg via INTRAVENOUS

## 2017-10-24 MED ORDER — SODIUM CHLORIDE 0.9 % IV SOLN
INTRAVENOUS | Status: DC
Start: 2017-10-24 — End: 2017-10-25
  Administered 2017-10-24 (×2): via INTRAVENOUS

## 2017-10-24 MED ORDER — TOBRAMYCIN-DEXAMETHASONE 0.3-0.1 % OP OINT
TOPICAL_OINTMENT | OPHTHALMIC | Status: AC
  Filled 2017-10-24: qty 3.5

## 2017-10-24 MED ORDER — HYPROMELLOSE (GONIOSCOPIC) 2.5 % OP SOLN
OPHTHALMIC | Status: AC
  Filled 2017-10-24: qty 15

## 2017-10-24 MED ORDER — ONDANSETRON HCL 4 MG/2ML IJ SOLN: 4.0000 mg | Freq: Once | INTRAMUSCULAR | Status: DC | PRN

## 2017-10-24 MED ORDER — LIDOCAINE HCL (CARDIAC) 20 MG/ML IV SOLN
INTRAVENOUS | Status: DC | PRN
  Administered 2017-10-24: 20 mg via INTRAVENOUS

## 2017-10-24 MED ORDER — DEXAMETHASONE SODIUM PHOSPHATE 10 MG/ML IJ SOLN
INTRAMUSCULAR | Status: AC
  Filled 2017-10-24: qty 1

## 2017-10-24 MED ORDER — EPINEPHRINE PF 1 MG/ML IJ SOLN
INTRAMUSCULAR | Status: DC | PRN
  Administered 2017-10-24: .3 mL

## 2017-10-24 MED ORDER — HYPROMELLOSE (GONIOSCOPIC) 2.5 % OP SOLN
OPHTHALMIC | Status: DC | PRN
  Administered 2017-10-24: 1 [drp] via OPHTHALMIC

## 2017-10-24 MED ORDER — MIDAZOLAM HCL 5 MG/5ML IJ SOLN
INTRAMUSCULAR | Status: DC | PRN
  Administered 2017-10-24: 2 mg via INTRAVENOUS

## 2017-10-24 MED ORDER — CEFAZOLIN SUBCONJUNCTIVAL INJECTION 100 MG/0.5 ML
100.0000 mg | INJECTION | SUBCONJUNCTIVAL | Status: DC
  Filled 2017-10-24: qty 5

## 2017-10-24 MED ORDER — PROPOFOL 500 MG/50ML IV EMUL
INTRAVENOUS | Status: DC | PRN
  Administered 2017-10-24: 100 ug/kg/min via INTRAVENOUS

## 2017-10-24 MED ORDER — BUPIVACAINE HCL (PF) 0.75 % IJ SOLN
INTRAMUSCULAR | Status: AC
  Filled 2017-10-24: qty 10

## 2017-10-24 MED ORDER — HYALURONIDASE HUMAN 150 UNIT/ML IJ SOLN
INTRAMUSCULAR | Status: DC | PRN
  Administered 2017-10-24: 150 [IU] via SUBCUTANEOUS

## 2017-10-24 MED ORDER — CEFAZOLIN SODIUM 1 G IJ SOLR
INTRAMUSCULAR | Status: DC | PRN
  Administered 2017-10-24: 100 mg via INTRAMUSCULAR

## 2017-10-24 MED ORDER — BSS IO SOLN
INTRAOCULAR | Status: AC
  Filled 2017-10-24: qty 15

## 2017-10-24 MED ORDER — ROCURONIUM BROMIDE 10 MG/ML (PF) SYRINGE
PREFILLED_SYRINGE | INTRAVENOUS | Status: AC
  Filled 2017-10-24: qty 5

## 2017-10-24 MED ORDER — OFLOXACIN 0.3 % OP SOLN
1.0000 [drp] | OPHTHALMIC | Status: AC | PRN
  Administered 2017-10-24 (×3): 1 [drp] via OPHTHALMIC
  Filled 2017-10-24: qty 5

## 2017-10-24 MED ORDER — LIDOCAINE HCL (PF) 1 % IJ SOLN
INTRAMUSCULAR | Status: DC | PRN
  Administered 2017-10-24: 10 mL

## 2017-10-24 MED ORDER — CYCLOPENTOLATE HCL 1 % OP SOLN
1.0000 [drp] | OPHTHALMIC | Status: AC | PRN
  Administered 2017-10-24 (×3): 1 [drp] via OPHTHALMIC
  Filled 2017-10-24: qty 2

## 2017-10-24 MED ORDER — BUPIVACAINE HCL (PF) 0.75 % IJ SOLN
INTRAMUSCULAR | Status: DC | PRN
  Administered 2017-10-24: 10 mL

## 2017-10-24 MED ORDER — PHENYLEPHRINE HCL 2.5 % OP SOLN
1.0000 [drp] | OPHTHALMIC | Status: AC | PRN
  Administered 2017-10-24 (×3): 1 [drp] via OPHTHALMIC
  Filled 2017-10-24: qty 2

## 2017-10-24 MED ORDER — LIDOCAINE 2% (20 MG/ML) 5 ML SYRINGE
INTRAMUSCULAR | Status: AC
  Filled 2017-10-24: qty 5

## 2017-10-24 MED ORDER — EPINEPHRINE PF 1 MG/ML IJ SOLN
INTRAMUSCULAR | Status: AC
  Filled 2017-10-24: qty 1

## 2017-10-24 MED ORDER — PROPOFOL 10 MG/ML IV BOLUS
INTRAVENOUS | Status: AC
  Filled 2017-10-24: qty 20

## 2017-10-24 MED ORDER — ATROPINE SULFATE 1 % OP SOLN
OPHTHALMIC | Status: AC
  Filled 2017-10-24: qty 5

## 2017-10-24 MED ORDER — MIDAZOLAM HCL 2 MG/2ML IJ SOLN
INTRAMUSCULAR | Status: AC
  Filled 2017-10-24: qty 2

## 2017-10-24 MED ORDER — BSS PLUS IO SOLN
INTRAOCULAR | Status: AC
  Filled 2017-10-24: qty 500

## 2017-10-24 MED ORDER — BSS IO SOLN
INTRAOCULAR | Status: DC | PRN
  Administered 2017-10-24: 15 mL via INTRAOCULAR

## 2017-10-24 MED ORDER — DEXAMETHASONE SODIUM PHOSPHATE 10 MG/ML IJ SOLN
INTRAMUSCULAR | Status: DC | PRN
  Administered 2017-10-24: 10 mg

## 2017-10-24 MED ORDER — ATROPINE SULFATE 1 % OP OINT
TOPICAL_OINTMENT | OPHTHALMIC | Status: DC | PRN
  Administered 2017-10-24: 1 via OPHTHALMIC

## 2017-10-24 MED ORDER — TOBRAMYCIN-DEXAMETHASONE 0.3-0.1 % OP OINT
TOPICAL_OINTMENT | OPHTHALMIC | Status: DC | PRN
  Administered 2017-10-24: 1 via OPHTHALMIC

## 2017-10-24 MED ORDER — BSS PLUS IO SOLN
INTRAOCULAR | Status: DC | PRN
  Administered 2017-10-24: 1 via INTRAOCULAR

## 2017-10-24 MED ORDER — ONDANSETRON HCL 4 MG/2ML IJ SOLN
INTRAMUSCULAR | Status: DC | PRN
  Administered 2017-10-24: 4 mg via INTRAVENOUS

## 2017-10-24 MED ORDER — FENTANYL CITRATE (PF) 250 MCG/5ML IJ SOLN
INTRAMUSCULAR | Status: AC
  Filled 2017-10-24: qty 5

## 2017-10-24 MED ORDER — MEPERIDINE HCL 25 MG/ML IJ SOLN: 6.2500 mg | INTRAMUSCULAR | Status: DC | PRN

## 2017-10-24 MED ORDER — PHENYLEPHRINE HCL 10 MG/ML IJ SOLN
INTRAMUSCULAR | Status: DC | PRN
  Administered 2017-10-24: 25 ug/min via INTRAVENOUS

## 2017-10-24 SURGICAL SUPPLY — 51 items
APPLICATOR COTTON TIP 6IN STRL (MISCELLANEOUS) ×3 IMPLANT
APPLICATOR DR MATTHEWS STRL (MISCELLANEOUS) ×3 IMPLANT
BLADE MINI 60D BLUE (BLADE) ×3 IMPLANT
BLADE MINI RND TIP GREEN BEAV (BLADE) IMPLANT
CANNULA ANT CHAM MAIN (OPHTHALMIC RELATED) IMPLANT
CANNULA DUALBORE 25G (CANNULA) ×3 IMPLANT
CAUTERY EYE LOW TEMP 1300F FIN (OPHTHALMIC RELATED) IMPLANT
CLOSURE WOUND 1/2 X4 (GAUZE/BANDAGES/DRESSINGS) ×1
CORDS BIPOLAR (ELECTRODE) IMPLANT
COVER SURGICAL LIGHT HANDLE (MISCELLANEOUS) ×3 IMPLANT
FILTER BLUE MILLIPORE (MISCELLANEOUS) IMPLANT
FILTER STRAW FLUID ASPIR (MISCELLANEOUS) IMPLANT
GAS AUTO FILL CONSTEL (OPHTHALMIC) ×3
GAS AUTO FILL CONSTELLATION (OPHTHALMIC) ×1 IMPLANT
GAS OPHTHALMIC (MISCELLANEOUS) IMPLANT
GLOVE BIOGEL PI IND STRL 6.5 (GLOVE) ×1 IMPLANT
GLOVE BIOGEL PI INDICATOR 6.5 (GLOVE) ×2
GLOVE ECLIPSE 7.5 STRL STRAW (GLOVE) ×3 IMPLANT
GOWN STRL REUS W/ TWL LRG LVL3 (GOWN DISPOSABLE) ×2 IMPLANT
GOWN STRL REUS W/TWL LRG LVL3 (GOWN DISPOSABLE) ×4
KIT BASIN OR (CUSTOM PROCEDURE TRAY) ×3 IMPLANT
KIT PERFLUORON PROCEDURE 5ML (MISCELLANEOUS) IMPLANT
KIT ROOM TURNOVER OR (KITS) ×3 IMPLANT
NEEDLE 18GX1X1/2 (RX/OR ONLY) (NEEDLE) ×3 IMPLANT
NEEDLE HYPO 25GX1X1/2 BEV (NEEDLE) IMPLANT
NEEDLE HYPO 30X.5 LL (NEEDLE) ×6 IMPLANT
NS IRRIG 1000ML POUR BTL (IV SOLUTION) ×3 IMPLANT
PACK VITRECTOMY CUSTOM (CUSTOM PROCEDURE TRAY) ×3 IMPLANT
PAD ARMBOARD 7.5X6 YLW CONV (MISCELLANEOUS) ×6 IMPLANT
PAK VITRECTOMY PIK 25 GA (OPHTHALMIC RELATED) IMPLANT
PENCIL BIPOLAR 25GA STR DISP (OPHTHALMIC RELATED) ×3 IMPLANT
PROBE LASER ILLUM FLEX CVD 25G (OPHTHALMIC) ×3 IMPLANT
REPL STRA BRUSH NEEDLE (NEEDLE) IMPLANT
RESERVOIR BACK FLUSH (MISCELLANEOUS) IMPLANT
RETRACTOR IRIS FLEX 25G GRIESH (INSTRUMENTS) IMPLANT
ROLLS DENTAL (MISCELLANEOUS) IMPLANT
SET FLUID INJECTOR (SET/KITS/TRAYS/PACK) IMPLANT
SHEET MEDIUM DRAPE 40X70 STRL (DRAPES) ×3 IMPLANT
SOLUTION ANTI FOG 6CC (MISCELLANEOUS) ×3 IMPLANT
STOCKINETTE IMPERVIOUS 9X36 MD (GAUZE/BANDAGES/DRESSINGS) ×6 IMPLANT
STOPCOCK 4 WAY LG BORE MALE ST (IV SETS) IMPLANT
STRIP CLOSURE SKIN 1/2X4 (GAUZE/BANDAGES/DRESSINGS) ×2 IMPLANT
SUT ETHILON 5.0 S-24 (SUTURE) ×3 IMPLANT
SUT SILK 2 0 (SUTURE) ×2
SUT SILK 2-0 18XBRD TIE 12 (SUTURE) ×1 IMPLANT
SUT VICRYL 7 0 TG140 8 (SUTURE) IMPLANT
SUT VICRYL 8 0 TG140 8 (SUTURE) ×3 IMPLANT
SYR 20CC LL (SYRINGE) IMPLANT
TOWEL OR 17X24 6PK STRL BLUE (TOWEL DISPOSABLE) ×6 IMPLANT
WATER STERILE IRR 1000ML POUR (IV SOLUTION) ×3 IMPLANT
WIPE INSTRUMENT VISIWIPE 73X73 (MISCELLANEOUS) IMPLANT

## 2017-10-24 NOTE — Anesthesia Procedure Notes (Signed)
Procedure Name: MAC Date/Time: 10/24/2017 5:06 PM Performed by: Kyung Rudd Pre-anesthesia Checklist: Patient identified, Emergency Drugs available, Suction available and Patient being monitored Patient Re-evaluated:Patient Re-evaluated prior to induction Oxygen Delivery Method: Nasal cannula Preoxygenation: Pre-oxygenation with 100% oxygen Induction Type: IV induction Placement Confirmation: CO2 detector Dental Injury: Teeth and Oropharynx as per pre-operative assessment

## 2017-10-24 NOTE — Discharge Instructions (Signed)
DO NOT SLEEP ON BACK, THE EYE PRESSURE CAN GO UP AND CAUSE VISION LOSS   SLEEP FACE DOWN OR ON RIGHT SIDE WITH NOSE TO PILLOW  DURING DAY KEEP FACE DOWN.  15 MINUTES EVERY 2 HOURS IT IS OK TO LOOK STRAIGHT AHEAD (USE BATHROOM, EAT, WALK, ETC.)

## 2017-10-24 NOTE — Anesthesia Preprocedure Evaluation (Addendum)
Anesthesia Evaluation  Patient identified by MRN, date of birth, ID band Patient awake    Reviewed: Allergy & Precautions, NPO status , Patient's Chart, lab work & pertinent test results  Airway Mallampati: II  TM Distance: >3 FB Neck ROM: Full    Dental no notable dental hx. (+) Poor Dentition, Chipped, Missing, Loose, Dental Advisory Given   Pulmonary neg pulmonary ROS, Current Smoker,    Pulmonary exam normal breath sounds clear to auscultation       Cardiovascular negative cardio ROS Normal cardiovascular exam Rhythm:Regular Rate:Normal     Neuro/Psych negative neurological ROS  negative psych ROS   GI/Hepatic negative GI ROS, Neg liver ROS,   Endo/Other  negative endocrine ROS  Renal/GU negative Renal ROS  negative genitourinary   Musculoskeletal negative musculoskeletal ROS (+)   Abdominal   Peds negative pediatric ROS (+)  Hematology negative hematology ROS (+)   Anesthesia Other Findings   Reproductive/Obstetrics negative OB ROS                             Anesthesia Physical Anesthesia Plan  ASA: III  Anesthesia Plan: General   Post-op Pain Management:    Induction: Intravenous  PONV Risk Score and Plan: 1 and Ondansetron and Dexamethasone  Airway Management Planned: Oral ETT and LMA  Additional Equipment:   Intra-op Plan:   Post-operative Plan: Extubation in OR  Informed Consent:   Plan Discussed with:   Anesthesia Plan Comments: (  )        Anesthesia Quick Evaluation

## 2017-10-24 NOTE — Transfer of Care (Signed)
Immediate Anesthesia Transfer of Care Note  Patient: Darren Lane  Procedure(s) Performed: REPAIR OF COMPLEX TRACTION RETINAL DETACHMENT (Right Eye) INSERTION OF GAS( SF6) (Right Eye) PARS PLANA VITRECTOMY WITH 25 GAUGE, ENDO LASER (Right Eye)  Patient Location: PACU  Anesthesia Type:MAC  Level of Consciousness: awake, alert , oriented and patient cooperative  Airway & Oxygen Therapy: Patient Spontanous Breathing  Post-op Assessment: Report given to RN and Post -op Vital signs reviewed and stable  Post vital signs: Reviewed and stable  Last Vitals:  Vitals:   10/24/17 1443 10/24/17 1900  BP: (!) 145/97 (!) 128/93  Pulse: 88 64  Resp: 18 12  Temp: 36.8 C 36.5 C  SpO2: 97% 95%    Last Pain:  Vitals:   10/24/17 1443  TempSrc: Oral         Complications: No apparent anesthesia complications

## 2017-10-24 NOTE — ED Notes (Addendum)
Attempted to take pt vitals, pt refused stating "hes tired of us trying to do all this stuff to him, if he was in an outpatient clinic he would have been seen and sent home and if we keep trying he will walk out the door and wait for his eye doctor."

## 2017-10-24 NOTE — H&P (Signed)
Date of examination:  10/24/17   Indication for surgery: Retinal detachment right eye  Pertinent past medical history:  Past Medical History:  Diagnosis Date  . Asthma     Pertinent ocular history:  Floaters and curtain in vision worsening over the past week.  Pertinent family history:  Family History  Problem Relation Age of Onset  . Asthma Neg Hx   . Diabetes Mellitus II Neg Hx     General:  Healthy appearing patient in no distress.     Eyes:    Acuity OD 20/80  OS 20/40  CC  External: Within normal limits      Anterior segment: Within normal limits        Fundus: Superior retinal detachment with break at 11:30     Impression: Retinal detachment right eye  Plan: Retinal detachment repair with PPVx, endolaser, AFx, drainage of subretinal fluid, placement of gas tamponade right eye  Harrold DonathNarendra Mafabhai Langdon Crosson

## 2017-10-24 NOTE — Discharge Instructions (Signed)
Sleep with right side down.  [place] [address] [place] [id number]. Be there are 9 am.

## 2017-10-24 NOTE — ED Provider Notes (Addendum)
[facility]-EMERGENCY DEPT Provider Note   CSN: [id number] Arrival date & time: 10/23/17  2250     History   Chief Complaint Chief Complaint  Patient presents with  . Eye Problem    HPI Darren Lane is a 56 y.o. male.  The history is provided by the patient.  Eye Problem   This is a new problem. The current episode started more than 2 days ago (3.5 nearly 4 days ago, awoke on Sunday with floaters then developed loss of vision of the outer lower quadrant of the right eye). Progression since onset: waxing and waning but worse x 1 day. There is a problem in the right eye. There was no injury mechanism. The patient is experiencing no pain. There is no history of trauma to the eye. There is no known exposure to pink eye. He wears contacts. Associated symptoms include decreased vision. Pertinent negatives include no numbness, no photophobia, no vomiting, no weakness and no itching. He has tried nothing for the symptoms. The treatment provided no relief.  Floaters and visual changes.    Past Medical History:  Diagnosis Date  . Asthma     Patient Active Problem List   Diagnosis Date Noted  . Acute encephalopathy 01/07/2017  . Community acquired pneumonia 01/07/2017  . 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 No pertinent surgical history.     Home Medications    Prior to Admission medications   Medication Sig Start Date End Date Taking? Authorizing Provider  aspirin EC 81 MG tablet Take 81 mg by mouth daily.   Yes [provider]    Family History Family History  Problem Relation Age of Onset  . Asthma Neg Hx   . Diabetes Mellitus II Neg Hx     Social History Social History  Substance Use Topics  . Smoking status: Current Every Day Smoker  . Smokeless tobacco: Never Used  . Alcohol use Yes     Comment: occasionally     Allergies   Patient has no known allergies.   Review of Systems Review of  Systems  Constitutional: Negative for fever.  Eyes: Positive for visual disturbance. Negative for photophobia and pain.  Gastrointestinal: Negative for vomiting.  Skin: Negative for itching.  Neurological: Negative for facial asymmetry, weakness, numbness and headaches.  All other systems reviewed and are negative.    Physical Exam Updated Vital Signs BP (!) 154/103 (BP Location: Left Arm)   Pulse 86   Temp (!) 97.5 F (36.4 C) (Oral)   Resp 18   SpO2 99%   Physical Exam  Constitutional: He is oriented to person, place, and time. He appears well-developed and well-nourished. No distress.  HENT:  Head: Normocephalic and atraumatic.  Mouth/Throat: No oropharyngeal exudate.  Eyes: Pupils are equal, round, and reactive to light. Conjunctivae and EOM are normal. Right eye exhibits no discharge. Left eye exhibits no discharge.  Pressure 13 in the right eye.  Field cut r outer lower quadrant on confrontation.    Neck: Normal range of motion. Neck supple.  Cardiovascular: Normal rate, regular rhythm, normal heart sounds and intact distal pulses.   Pulmonary/Chest: Effort normal and breath sounds normal. He has no wheezes. He has no rales.  Abdominal: Soft. Bowel sounds are normal. He exhibits no mass. There is no tenderness. There is no rebound and no guarding.  Musculoskeletal: Normal range of motion.  Neurological: He is alert and oriented to person, place, and time. He  displays normal reflexes.  Skin: Skin is warm and dry. Capillary refill takes less than 2 seconds.  Psychiatric: He has a normal mood and affect.     ED Treatments / Results   Vitals:   10/23/17 2301  BP: (!) 154/103  Pulse: 86  Resp: 18  Temp: (!) 97.5 F (36.4 C)  SpO2: 99%    Radiology Ct Orbits Wo Contrast  Result Date: 10/24/2017 CLINICAL DATA:  RIGHT vision changes. EXAM: CT ORBITS WITHOUT CONTRAST TECHNIQUE: Multidetector CT images were obtained using the standard protocol without intravenous  contrast. COMPARISON:  CT angiogram head and neck January 08, 2017, MRI of the head January 07, 2017 FINDINGS: ORBITS: Intact ocular globes, unchanged mild symmetric ocular globe enlargement. Lenses are located. Normal appearance of the optic nerve sheath complexes. Preservation of the orbital fat. Normal appearance of the extraocular muscles which are well located. Superior ophthalmic veins are not enlarged. SINUSES: Moderate lobulated paranasal sinusitis with chronic bony remodeling less sphenoid sinus, pneumatized lateral recesses. Included mastoid air cells are well aerated, pneumatized petrous apices. INTRACRANIAL CONTENTS: Normal. OSSEOUS STRUCTURES/ SOFT TISSUES: No significant soft tissue swelling, no subcutaneous gas or radiopaque foreign bodies. No destructive bony lesions. IMPRESSION: 1. No acute orbital process. 2. Mild buphthalmos. 3. Moderate paranasal sinusitis. Electronically Signed   By: Awilda Metro M.D.   On: 10/24/2017 00:38    Procedures Procedures (including critical care time)   1245 Case d/w Dr. Allena Katz, of ophthalmology. He has reviewed imaging.  Please have have patient sleep with left side down and present to the office on [address] in [place] at 9 am today.       Visual Acuity  Right Eye Distance: 20/50 Left Eye Distance: 20/50 Bilateral Distance: 20/50  Right Eye Near:   Left Eye Near:    Bilateral Near:     Final Clinical Impressions(s) / ED Diagnoses   Final diagnoses:  Floaters in visual field, right   Strict return precautions given for  Shortness of breath, swelling or the lips or tongue, chest pain, dyspnea on exertion, new weakness or numbness changes in vision or speech,  Inability to tolerate liquids or food, changes in voice cough, altered mental status or any concerns. No signs of systemic illness or infection. The patient is nontoxic-appearing on exam and vital signs are within normal limits.    I have reviewed the triage vital  signs and the nursing notes. Pertinent labs &imaging results that were available during my care of the patient were reviewed by me and considered in my medical decision making (see chart for details).  After history, exam, and medical workup I feel the patient has been appropriately medically screened and is safe for discharge home. Pertinent diagnoses were discussed with the patient. Patient was given return precautions.      Alysa Duca, MD 10/24/17 [phone]

## 2017-10-24 NOTE — ED Notes (Signed)
Pt refused to keep pulse oximetry on.  Stated he was here for his eye not for his oxygen.  Also states he does not want dilation in his noneffected eye because he is not here for that eye.

## 2017-10-24 NOTE — Brief Op Note (Signed)
10/24/2017  6:48 PM  PATIENT:  Darren Lane  56 y.o. male  PRE-OPERATIVE DIAGNOSIS:  Detached retina  POST-OPERATIVE DIAGNOSIS:  Detached retina right eye  PROCEDURE:  Procedure(s): REPAIR OF  RETINAL DETACHMENT (Right) INSERTION OF GAS( SF6) (Right) PARS PLANA VITRECTOMY WITH 25 GAUGE, ENDO LASER (Right)  SURGEON:  Surgeon(s) and Role:    * Jalene Mullet, MD - Primary  PHYSICIAN ASSISTANT:   ASSISTANTS: none   ANESTHESIA:   local and MAC  EBL:  Minimal   BLOOD ADMINISTERED:none  DRAINS: none   LOCAL MEDICATIONS USED:  MARCAINE    and LIDOCAINE   SPECIMEN:  No Specimen  DISPOSITION OF SPECIMEN:  N/A  COUNTS:  YES  TOURNIQUET:  * No tourniquets in log *  DICTATION: .Note written in EPIC  PLAN OF CARE: Discharge to home after PACU  PATIENT DISPOSITION:  PACU - hemodynamically stable.   Delay start of Pharmacological VTE agent (>24hrs) due to surgical blood loss or risk of bleeding: not applicable

## 2017-10-25 ENCOUNTER — Encounter (HOSPITAL_COMMUNITY): Payer: Self-pay | Admitting: Ophthalmology

## 2017-10-28 NOTE — Anesthesia Postprocedure Evaluation (Signed)
Anesthesia Post Note  Patient: Darren Lane  Procedure(s) Performed: REPAIR OF COMPLEX TRACTION RETINAL DETACHMENT (Right Eye) INSERTION OF GAS( SF6) (Right Eye) PARS PLANA VITRECTOMY WITH 25 GAUGE, ENDO LASER (Right Eye)     Patient location during evaluation: PACU Anesthesia Type: General Level of consciousness: awake and alert Pain management: pain level controlled Vital Signs Assessment: post-procedure vital signs reviewed and stable Respiratory status: spontaneous breathing, nonlabored ventilation, respiratory function stable and patient connected to nasal cannula oxygen Cardiovascular status: blood pressure returned to baseline and stable Postop Assessment: no apparent nausea or vomiting Anesthetic complications: no    Last Vitals:  Vitals:   10/24/17 1900 10/24/17 1915  BP: (!) 128/93 (!) 139/95  Pulse: 64 70  Resp: 12 10  Temp: 36.5 C   SpO2: 95% 95%    Last Pain:  Vitals:   10/24/17 1443  TempSrc: Oral                 Freman Lapage S

## 2017-11-07 NOTE — Op Note (Signed)
Darlyn Chamberlan R Gruenberg 11/07/2017 Diagnosis: Retinal detachment right eye  Procedure: Retinal detachment repair right eye with pars plana vitrectomy, air/fluid exchange, endolaser, and gas tamponade Operative Eye:  Right eye Surgeon: Harrold DonathNarendra Mafabhai Elwyn Lowden Estimated Blood Loss: minimal Specimens for Pathology:  None Complications:  None   The  patient was prepped and draped in the usual fashion for ocular surgery on the  Right eye .  A lid speculum was placed.  Infusion line and trocar was placed at the 8 o'clock position approximately 3.5 mm from the surgical limbus.   The infusion line was allowed to run and then clamped when placed at the cannula opening. The line was inserted and secured to the drape with an adhesive strip.   Active trocars/cannula were placed at the 10 and 2 o'clock positions approximately 3.5 mm from the surgical limbus. The cannula was visualized in the vitreous cavity.  The light pipe and vitreous cutter were inserted into the vitreous cavity and a core vitrectomy was performed.  Care taken to remove the vitreous up to the vitreous base for 360 degrees.   Care was taken to limit any traction on the retinal detachment.  Vitreous strands to the retinal detachment were severed with the vitrector.  A complete air/fluid exchange was performed.  The retinal detachment was flattened.  Endolaser was applied surrounding the breaks.  16% SF6 gas was placed and 3 rows of endolaser were applied 360 degrees to the periphery.  The superior cannulas were sequentially removed with concommitant tamponade using a cotton tipped applicator and noted to be air tight.  The infusion line and trocar were removed and the sclerotomy was noted to be air tight with normal intraocular pressure by digital palpapation.  Subconjunctival injections of  Dexamethasone 4mg /401ml was placed in the infero-medial quadrant.   The speculum and drapes were removed and the eye was patched with Polymixin/Bacitracin ophthalmic  ointment. An eye shield was placed and the patient was transferred alert and conversant with stable vital signs to the post operative recovery area.  The patient tolerated the procedure well and no complications were noted.  Harrold DonathNarendra Mafabhai Shaindel Sweeten MD

## 2019-11-09 ENCOUNTER — Other Ambulatory Visit: Payer: Self-pay

## 2019-11-09 DIAGNOSIS — Z20822 Contact with and (suspected) exposure to covid-19: Secondary | ICD-10-CM

## 2019-11-11 LAB — NOVEL CORONAVIRUS, NAA: SARS-CoV-2, NAA: NOT DETECTED

## 2022-09-09 ENCOUNTER — Emergency Department (HOSPITAL_COMMUNITY): Payer: Medicaid Other

## 2022-09-09 ENCOUNTER — Inpatient Hospital Stay (HOSPITAL_COMMUNITY): Payer: Medicaid Other

## 2022-09-09 ENCOUNTER — Inpatient Hospital Stay (HOSPITAL_COMMUNITY)
Admission: EM | Admit: 2022-09-09 | Discharge: 2022-09-30 | DRG: 917 | Disposition: E | Payer: Medicaid Other | Attending: Internal Medicine | Admitting: Internal Medicine

## 2022-09-09 ENCOUNTER — Other Ambulatory Visit: Payer: Self-pay

## 2022-09-09 ENCOUNTER — Encounter (HOSPITAL_COMMUNITY): Payer: Self-pay | Admitting: Emergency Medicine

## 2022-09-09 DIAGNOSIS — T380X5A Adverse effect of glucocorticoids and synthetic analogues, initial encounter: Secondary | ICD-10-CM | POA: Diagnosis not present

## 2022-09-09 DIAGNOSIS — J9601 Acute respiratory failure with hypoxia: Secondary | ICD-10-CM | POA: Diagnosis present

## 2022-09-09 DIAGNOSIS — T40411A Poisoning by fentanyl or fentanyl analogs, accidental (unintentional), initial encounter: Secondary | ICD-10-CM | POA: Diagnosis present

## 2022-09-09 DIAGNOSIS — E872 Acidosis, unspecified: Secondary | ICD-10-CM | POA: Diagnosis present

## 2022-09-09 DIAGNOSIS — I10 Essential (primary) hypertension: Secondary | ICD-10-CM | POA: Diagnosis present

## 2022-09-09 DIAGNOSIS — Z8673 Personal history of transient ischemic attack (TIA), and cerebral infarction without residual deficits: Secondary | ICD-10-CM

## 2022-09-09 DIAGNOSIS — T50904A Poisoning by unspecified drugs, medicaments and biological substances, undetermined, initial encounter: Secondary | ICD-10-CM | POA: Diagnosis not present

## 2022-09-09 DIAGNOSIS — J9602 Acute respiratory failure with hypercapnia: Secondary | ICD-10-CM | POA: Diagnosis present

## 2022-09-09 DIAGNOSIS — I469 Cardiac arrest, cause unspecified: Secondary | ICD-10-CM

## 2022-09-09 DIAGNOSIS — R0689 Other abnormalities of breathing: Secondary | ICD-10-CM

## 2022-09-09 DIAGNOSIS — F1910 Other psychoactive substance abuse, uncomplicated: Secondary | ICD-10-CM

## 2022-09-09 DIAGNOSIS — G931 Anoxic brain damage, not elsewhere classified: Secondary | ICD-10-CM | POA: Diagnosis present

## 2022-09-09 DIAGNOSIS — G936 Cerebral edema: Secondary | ICD-10-CM | POA: Diagnosis present

## 2022-09-09 DIAGNOSIS — D72829 Elevated white blood cell count, unspecified: Secondary | ICD-10-CM

## 2022-09-09 DIAGNOSIS — G253 Myoclonus: Secondary | ICD-10-CM | POA: Diagnosis present

## 2022-09-09 DIAGNOSIS — R7303 Prediabetes: Secondary | ICD-10-CM | POA: Diagnosis not present

## 2022-09-09 DIAGNOSIS — F17210 Nicotine dependence, cigarettes, uncomplicated: Secondary | ICD-10-CM | POA: Diagnosis present

## 2022-09-09 DIAGNOSIS — K7200 Acute and subacute hepatic failure without coma: Secondary | ICD-10-CM | POA: Diagnosis present

## 2022-09-09 DIAGNOSIS — Z7982 Long term (current) use of aspirin: Secondary | ICD-10-CM

## 2022-09-09 DIAGNOSIS — E781 Pure hyperglyceridemia: Secondary | ICD-10-CM | POA: Diagnosis present

## 2022-09-09 DIAGNOSIS — F1410 Cocaine abuse, uncomplicated: Secondary | ICD-10-CM | POA: Diagnosis present

## 2022-09-09 DIAGNOSIS — R569 Unspecified convulsions: Secondary | ICD-10-CM | POA: Diagnosis present

## 2022-09-09 DIAGNOSIS — E8729 Other acidosis: Secondary | ICD-10-CM | POA: Diagnosis present

## 2022-09-09 DIAGNOSIS — J45901 Unspecified asthma with (acute) exacerbation: Secondary | ICD-10-CM | POA: Diagnosis present

## 2022-09-09 DIAGNOSIS — I959 Hypotension, unspecified: Secondary | ICD-10-CM | POA: Diagnosis present

## 2022-09-09 DIAGNOSIS — J690 Pneumonitis due to inhalation of food and vomit: Secondary | ICD-10-CM | POA: Diagnosis present

## 2022-09-09 DIAGNOSIS — E875 Hyperkalemia: Secondary | ICD-10-CM | POA: Diagnosis present

## 2022-09-09 DIAGNOSIS — K760 Fatty (change of) liver, not elsewhere classified: Secondary | ICD-10-CM | POA: Diagnosis present

## 2022-09-09 DIAGNOSIS — R778 Other specified abnormalities of plasma proteins: Secondary | ICD-10-CM

## 2022-09-09 DIAGNOSIS — Z515 Encounter for palliative care: Secondary | ICD-10-CM

## 2022-09-09 DIAGNOSIS — Z66 Do not resuscitate: Secondary | ICD-10-CM

## 2022-09-09 DIAGNOSIS — I468 Cardiac arrest due to other underlying condition: Secondary | ICD-10-CM | POA: Diagnosis present

## 2022-09-09 DIAGNOSIS — D6959 Other secondary thrombocytopenia: Secondary | ICD-10-CM | POA: Diagnosis present

## 2022-09-09 DIAGNOSIS — R739 Hyperglycemia, unspecified: Secondary | ICD-10-CM | POA: Diagnosis present

## 2022-09-09 DIAGNOSIS — R7401 Elevation of levels of liver transaminase levels: Secondary | ICD-10-CM | POA: Diagnosis present

## 2022-09-09 DIAGNOSIS — F1190 Opioid use, unspecified, uncomplicated: Secondary | ICD-10-CM | POA: Diagnosis present

## 2022-09-09 DIAGNOSIS — Z20822 Contact with and (suspected) exposure to covid-19: Secondary | ICD-10-CM | POA: Diagnosis present

## 2022-09-09 LAB — GLUCOSE, CAPILLARY
Glucose-Capillary: 152 mg/dL — ABNORMAL HIGH (ref 70–99)
Glucose-Capillary: 154 mg/dL — ABNORMAL HIGH (ref 70–99)
Glucose-Capillary: 159 mg/dL — ABNORMAL HIGH (ref 70–99)
Glucose-Capillary: 164 mg/dL — ABNORMAL HIGH (ref 70–99)
Glucose-Capillary: 288 mg/dL — ABNORMAL HIGH (ref 70–99)

## 2022-09-09 LAB — POCT I-STAT 7, (LYTES, BLD GAS, ICA,H+H)
Acid-base deficit: 3 mmol/L — ABNORMAL HIGH (ref 0.0–2.0)
Acid-base deficit: 1 mmol/L (ref 0.0–2.0)
Bicarbonate: 22.9 mmol/L (ref 20.0–28.0)
Bicarbonate: 23.6 mmol/L (ref 20.0–28.0)
Calcium, Ion: 1.12 mmol/L — ABNORMAL LOW (ref 1.15–1.40)
Calcium, Ion: 1.1 mmol/L — ABNORMAL LOW (ref 1.15–1.40)
HCT: 46 % (ref 39.0–52.0)
HCT: 41 % (ref 39.0–52.0)
Hemoglobin: 15.6 g/dL (ref 13.0–17.0)
Hemoglobin: 13.9 g/dL (ref 13.0–17.0)
O2 Saturation: 98 %
O2 Saturation: 92 %
Patient temperature: 37.1
Patient temperature: 98.2
Potassium: 3.9 mmol/L (ref 3.5–5.1)
Potassium: 3.7 mmol/L (ref 3.5–5.1)
Sodium: 138 mmol/L (ref 135–145)
Sodium: 141 mmol/L (ref 135–145)
TCO2: 24 mmol/L (ref 22–32)
TCO2: 25 mmol/L (ref 22–32)
pCO2 arterial: 43.6 mmHg (ref 32–48)
pCO2 arterial: 36.6 mmHg (ref 32–48)
pH, Arterial: 7.329 — ABNORMAL LOW (ref 7.35–7.45)
pH, Arterial: 7.416 (ref 7.35–7.45)
pO2, Arterial: 105 mmHg (ref 83–108)
pO2, Arterial: 62 mmHg — ABNORMAL LOW (ref 83–108)

## 2022-09-09 LAB — BLOOD CULTURE ID PANEL (REFLEXED) - BCID2

## 2022-09-09 LAB — I-STAT ARTERIAL BLOOD GAS, ED
Acid-base deficit: 6 mmol/L — ABNORMAL HIGH (ref 0.0–2.0)
Acid-base deficit: 5 mmol/L — ABNORMAL HIGH (ref 0.0–2.0)
Bicarbonate: 26.1 mmol/L (ref 20.0–28.0)
Bicarbonate: 25.1 mmol/L (ref 20.0–28.0)
Calcium, Ion: 1.22 mmol/L (ref 1.15–1.40)
Calcium, Ion: 1.22 mmol/L (ref 1.15–1.40)
HCT: 47 % (ref 39.0–52.0)
HCT: 46 % (ref 39.0–52.0)
Hemoglobin: 16 g/dL (ref 13.0–17.0)
Hemoglobin: 15.6 g/dL (ref 13.0–17.0)
O2 Saturation: 100 %
O2 Saturation: 94 %
Patient temperature: 96.2
Patient temperature: 96.2
Potassium: 4.7 mmol/L (ref 3.5–5.1)
Potassium: 4.5 mmol/L (ref 3.5–5.1)
Sodium: 138 mmol/L (ref 135–145)
Sodium: 138 mmol/L (ref 135–145)
TCO2: 29 mmol/L (ref 22–32)
TCO2: 27 mmol/L (ref 22–32)
pCO2 arterial: 81.6 mmHg (ref 32–48)
pCO2 arterial: 62.8 mmHg — ABNORMAL HIGH (ref 32–48)
pH, Arterial: 7.105 — CL (ref 7.35–7.45)
pH, Arterial: 7.201 — ABNORMAL LOW (ref 7.35–7.45)
pO2, Arterial: 510 mmHg — ABNORMAL HIGH (ref 83–108)
pO2, Arterial: 81 mmHg — ABNORMAL LOW (ref 83–108)

## 2022-09-09 LAB — CBC
HCT: 40.9 % (ref 39.0–52.0)
Hemoglobin: 13.2 g/dL (ref 13.0–17.0)
MCH: 30.4 pg (ref 26.0–34.0)
MCHC: 32.3 g/dL (ref 30.0–36.0)
MCV: 94.2 fL (ref 80.0–100.0)
Platelets: 105 10*3/uL — ABNORMAL LOW (ref 150–400)
RBC: 4.34 MIL/uL (ref 4.22–5.81)
RDW: 13 % (ref 11.5–15.5)
WBC: 15.3 10*3/uL — ABNORMAL HIGH (ref 4.0–10.5)
nRBC: 0 % (ref 0.0–0.2)

## 2022-09-09 LAB — RAPID URINE DRUG SCREEN, HOSP PERFORMED
Amphetamines: NOT DETECTED
Barbiturates: NOT DETECTED
Benzodiazepines: NOT DETECTED
Cocaine: POSITIVE — AB
Opiates: POSITIVE — AB
Tetrahydrocannabinol: NOT DETECTED

## 2022-09-09 LAB — CBC WITH DIFFERENTIAL/PLATELET
Abs Immature Granulocytes: 0.71 10*3/uL — ABNORMAL HIGH (ref 0.00–0.07)
Basophils Absolute: 0.1 10*3/uL (ref 0.0–0.1)
Basophils Relative: 1 %
Eosinophils Absolute: 0.9 10*3/uL — ABNORMAL HIGH (ref 0.0–0.5)
Eosinophils Relative: 6 %
HCT: 44.4 % (ref 39.0–52.0)
Hemoglobin: 14 g/dL (ref 13.0–17.0)
Immature Granulocytes: 5 %
Lymphocytes Relative: 29 %
Lymphs Abs: 4.1 10*3/uL — ABNORMAL HIGH (ref 0.7–4.0)
MCH: 30.3 pg (ref 26.0–34.0)
MCHC: 31.5 g/dL (ref 30.0–36.0)
MCV: 96.1 fL (ref 80.0–100.0)
Monocytes Absolute: 1.1 10*3/uL — ABNORMAL HIGH (ref 0.1–1.0)
Monocytes Relative: 8 %
Neutro Abs: 7.1 10*3/uL (ref 1.7–7.7)
Neutrophils Relative %: 51 %
Platelets: 281 10*3/uL (ref 150–400)
RBC: 4.62 MIL/uL (ref 4.22–5.81)
RDW: 12.8 % (ref 11.5–15.5)
WBC: 14.1 10*3/uL — ABNORMAL HIGH (ref 4.0–10.5)
nRBC: 0 % (ref 0.0–0.2)

## 2022-09-09 LAB — COMPREHENSIVE METABOLIC PANEL
ALT: 121 U/L — ABNORMAL HIGH (ref 0–44)
AST: 148 U/L — ABNORMAL HIGH (ref 15–41)
Albumin: 3.6 g/dL (ref 3.5–5.0)
Alkaline Phosphatase: 59 U/L (ref 38–126)
Anion gap: 13 (ref 5–15)
BUN: 18 mg/dL (ref 6–20)
CO2: 18 mmol/L — ABNORMAL LOW (ref 22–32)
Calcium: 8.4 mg/dL — ABNORMAL LOW (ref 8.9–10.3)
Chloride: 107 mmol/L (ref 98–111)
Creatinine, Ser: 1.19 mg/dL (ref 0.61–1.24)
GFR, Estimated: >60 mL/min (ref >60)
Glucose, Bld: 229 mg/dL — ABNORMAL HIGH (ref 70–99)
Potassium: 5.2 mmol/L — ABNORMAL HIGH (ref 3.5–5.1)
Sodium: 138 mmol/L (ref 135–145)
Total Bilirubin: 0.3 mg/dL (ref 0.3–1.2)
Total Protein: 6.3 g/dL — ABNORMAL LOW (ref 6.5–8.1)

## 2022-09-09 LAB — TROPONIN I (HIGH SENSITIVITY)
Troponin I (High Sensitivity): 31 ng/L — ABNORMAL HIGH (ref <18)
Troponin I (High Sensitivity): 54 ng/L — ABNORMAL HIGH (ref <18)

## 2022-09-09 LAB — RESP PANEL BY RT-PCR (FLU A&B, COVID) ARPGX2
Influenza A by PCR: NEGATIVE
Influenza B by PCR: NEGATIVE
SARS Coronavirus 2 by RT PCR: NEGATIVE

## 2022-09-09 LAB — MAGNESIUM: Magnesium: 2.2 mg/dL (ref 1.7–2.4)

## 2022-09-09 LAB — BASIC METABOLIC PANEL WITH GFR
Anion gap: 11 (ref 5–15)
BUN: 21 mg/dL — ABNORMAL HIGH (ref 6–20)
CO2: 22 mmol/L (ref 22–32)
Calcium: 8.4 mg/dL — ABNORMAL LOW (ref 8.9–10.3)
Chloride: 107 mmol/L (ref 98–111)
Creatinine, Ser: 0.99 mg/dL (ref 0.61–1.24)
GFR, Estimated: >60 mL/min
Glucose, Bld: 172 mg/dL — ABNORMAL HIGH (ref 70–99)
Potassium: 4 mmol/L (ref 3.5–5.1)
Sodium: 140 mmol/L (ref 135–145)

## 2022-09-09 LAB — URINALYSIS, ROUTINE W REFLEX MICROSCOPIC
Bacteria, UA: NONE SEEN
Bilirubin Urine: NEGATIVE
Glucose, UA: 150 mg/dL — AB
Ketones, ur: NEGATIVE mg/dL
Leukocytes,Ua: NEGATIVE
Nitrite: NEGATIVE
Protein, ur: 100 mg/dL — AB
Specific Gravity, Urine: 1.015 (ref 1.005–1.030)
pH: 6 (ref 5.0–8.0)

## 2022-09-09 LAB — MRSA NEXT GEN BY PCR, NASAL: MRSA by PCR Next Gen: NOT DETECTED

## 2022-09-09 LAB — LACTIC ACID, PLASMA
Lactic Acid, Venous: 7 mmol/L (ref 0.5–1.9)
Lactic Acid, Venous: 1.8 mmol/L (ref 0.5–1.9)
Lactic Acid, Venous: 4.1 mmol/L (ref 0.5–1.9)

## 2022-09-09 LAB — HEMOGLOBIN A1C
Hgb A1c MFr Bld: 6.4 % — ABNORMAL HIGH (ref 4.8–5.6)
Mean Plasma Glucose: 136.98 mg/dL

## 2022-09-09 LAB — ECHOCARDIOGRAM COMPLETE
AR max vel: 3.2 cm2
AV Area VTI: 3.48 cm2
AV Area mean vel: 3.23 cm2
AV Mean grad: 3 mmHg
AV Peak grad: 5.9 mmHg
Ao pk vel: 1.21 m/s
Area-P 1/2: 3.37 cm2
Height: 69 in
S' Lateral: 2.8 cm
Weight: 3114.66 oz

## 2022-09-09 LAB — CBG MONITORING, ED: Glucose-Capillary: 181 mg/dL — ABNORMAL HIGH (ref 70–99)

## 2022-09-09 LAB — ETHANOL: Alcohol, Ethyl (B): <10 mg/dL (ref <10)

## 2022-09-09 LAB — ACETAMINOPHEN LEVEL: Acetaminophen (Tylenol), Serum: <10 ug/mL — ABNORMAL LOW (ref 10–30)

## 2022-09-09 LAB — AMMONIA: Ammonia: 67 umol/L — ABNORMAL HIGH (ref 9–35)

## 2022-09-09 LAB — CREATININE, SERUM
Creatinine, Ser: 0.72 mg/dL (ref 0.61–1.24)
GFR, Estimated: >60 mL/min (ref >60)

## 2022-09-09 LAB — SALICYLATE LEVEL: Salicylate Lvl: <7 mg/dL — ABNORMAL LOW (ref 7.0–30.0)

## 2022-09-09 MED ORDER — INFLUENZA VAC SPLIT QUAD 0.5 ML IM SUSY: 0.5000 mL | PREFILLED_SYRINGE | INTRAMUSCULAR | Status: DC | PRN

## 2022-09-09 MED ORDER — IPRATROPIUM-ALBUTEROL 0.5-2.5 (3) MG/3ML IN SOLN
3.0000 mL | RESPIRATORY_TRACT | Status: DC
  Administered 2022-09-09 – 2022-09-10 (×11): 3 mL via RESPIRATORY_TRACT
  Filled 2022-09-09 (×11): qty 3

## 2022-09-09 MED ORDER — ACETAMINOPHEN 650 MG RE SUPP: 650.0000 mg | RECTAL | Status: DC

## 2022-09-09 MED ORDER — MIDAZOLAM HCL 2 MG/2ML IJ SOLN
2.0000 mg | INTRAMUSCULAR | Status: DC | PRN
Start: 2022-09-09 — End: 2022-09-13
  Administered 2022-09-11 (×2): 2 mg via INTRAVENOUS
  Filled 2022-09-09 (×2): qty 2

## 2022-09-09 MED ORDER — INSULIN ASPART 100 UNIT/ML IJ SOLN
0.0000 [IU] | INTRAMUSCULAR | Status: DC
  Administered 2022-09-09: 1 [IU] via SUBCUTANEOUS
  Administered 2022-09-09: 3 [IU] via SUBCUTANEOUS
  Administered 2022-09-09 – 2022-09-12 (×14): 1 [IU] via SUBCUTANEOUS

## 2022-09-09 MED ORDER — ACETAMINOPHEN 325 MG PO TABS: 650.0000 mg | ORAL_TABLET | ORAL | Status: DC

## 2022-09-09 MED ORDER — PROPOFOL 1000 MG/100ML IV EMUL
INTRAVENOUS | Status: AC
  Administered 2022-09-09: 30 ug/kg/min
  Filled 2022-09-09: qty 100

## 2022-09-09 MED ORDER — FENTANYL 2500MCG IN NS 250ML (10MCG/ML) PREMIX INFUSION
0.0000 ug/h | INTRAVENOUS | Status: DC
  Administered 2022-09-09: 50 ug/h via INTRAVENOUS

## 2022-09-09 MED ORDER — SODIUM ZIRCONIUM CYCLOSILICATE 10 G PO PACK: 10.0000 g | PACK | Freq: Once | ORAL | Status: DC

## 2022-09-09 MED ORDER — LEVETIRACETAM 500 MG/5ML IV SOLN
4500.0000 mg | Freq: Once | INTRAVENOUS | Status: AC
Start: 2022-09-09 — End: 2022-09-09
  Administered 2022-09-09: 4500 mg via INTRAVENOUS
  Filled 2022-09-09: qty 45

## 2022-09-09 MED ORDER — BUSPIRONE HCL 10 MG PO TABS: 30.0000 mg | ORAL_TABLET | Freq: Three times a day (TID) | ORAL | Status: AC | PRN

## 2022-09-09 MED ORDER — PROPOFOL 1000 MG/100ML IV EMUL
60.0000 ug/kg/min | INTRAVENOUS | Status: DC
  Administered 2022-09-09 – 2022-09-10 (×5): 60 ug/kg/min via INTRAVENOUS
  Administered 2022-09-10: 40 ug/kg/min via INTRAVENOUS
  Administered 2022-09-10 (×2): 60 ug/kg/min via INTRAVENOUS
  Administered 2022-09-11: 40 ug/kg/min via INTRAVENOUS
  Administered 2022-09-11: 20 ug/kg/min via INTRAVENOUS
  Administered 2022-09-11: 60 ug/kg/min via INTRAVENOUS
  Administered 2022-09-11: 40 ug/kg/min via INTRAVENOUS
  Administered 2022-09-11: 60 ug/kg/min via INTRAVENOUS
  Administered 2022-09-11: 50 ug/kg/min via INTRAVENOUS
  Administered 2022-09-11: 60 ug/kg/min via INTRAVENOUS
  Administered 2022-09-12: 35 ug/kg/min via INTRAVENOUS
  Administered 2022-09-12 – 2022-09-13 (×8): 60 ug/kg/min via INTRAVENOUS
  Administered 2022-09-13 (×2): 35 ug/kg/min via INTRAVENOUS
  Filled 2022-09-09 (×27): qty 100

## 2022-09-09 MED ORDER — PERFLUTREN LIPID MICROSPHERE
1.0000 mL | INTRAVENOUS | Status: AC | PRN
  Administered 2022-09-09: 2 mL via INTRAVENOUS

## 2022-09-09 MED ORDER — HEPARIN SODIUM (PORCINE) 5000 UNIT/ML IJ SOLN
5000.0000 [IU] | Freq: Three times a day (TID) | INTRAMUSCULAR | Status: DC
  Administered 2022-09-09 – 2022-09-13 (×12): 5000 [IU] via SUBCUTANEOUS
  Filled 2022-09-09 (×12): qty 1

## 2022-09-09 MED ORDER — METHYLPREDNISOLONE SODIUM SUCC 125 MG IJ SOLR
125.0000 mg | Freq: Two times a day (BID) | INTRAMUSCULAR | Status: DC
  Administered 2022-09-09 – 2022-09-11 (×5): 125 mg via INTRAVENOUS
  Filled 2022-09-09 (×5): qty 2

## 2022-09-09 MED ORDER — ORAL CARE MOUTH RINSE
15.0000 mL | OROMUCOSAL | Status: DC | PRN
Start: 2022-09-09 — End: 2022-09-13

## 2022-09-09 MED ORDER — CEFTRIAXONE SODIUM 2 G IJ SOLR
2.0000 g | INTRAMUSCULAR | Status: DC
  Administered 2022-09-09 – 2022-09-12 (×4): 2 g via INTRAVENOUS
  Filled 2022-09-09 (×5): qty 20

## 2022-09-09 MED ORDER — POLYETHYLENE GLYCOL 3350 17 G PO PACK
17.0000 g | PACK | Freq: Every day | ORAL | Status: DC
Start: 2022-09-09 — End: 2022-09-14
  Administered 2022-09-09 – 2022-09-12 (×3): 17 g
  Filled 2022-09-09 (×3): qty 1

## 2022-09-09 MED ORDER — INFLUENZA VAC SPLIT QUAD 0.5 ML IM SUSY: 0.5000 mL | PREFILLED_SYRINGE | INTRAMUSCULAR | Status: DC

## 2022-09-09 MED ORDER — PNEUMOCOCCAL 20-VAL CONJ VACC 0.5 ML IM SUSY: 0.5000 mL | PREFILLED_SYRINGE | INTRAMUSCULAR | Status: DC

## 2022-09-09 MED ORDER — ACETAMINOPHEN 650 MG RE SUPP: 650.0000 mg | RECTAL | Status: DC | PRN

## 2022-09-09 MED ORDER — ROCURONIUM BROMIDE 10 MG/ML (PF) SYRINGE: 80.0000 mg | PREFILLED_SYRINGE | Freq: Once | INTRAVENOUS | Status: DC

## 2022-09-09 MED ORDER — ACETAMINOPHEN 325 MG PO TABS: 650.0000 mg | ORAL_TABLET | ORAL | Status: DC | PRN

## 2022-09-09 MED ORDER — LEVETIRACETAM IN NACL 1500 MG/100ML IV SOLN
1500.0000 mg | Freq: Two times a day (BID) | INTRAVENOUS | Status: DC
  Administered 2022-09-09 – 2022-09-12 (×7): 1500 mg via INTRAVENOUS
  Filled 2022-09-09 (×9): qty 100

## 2022-09-09 MED ORDER — MIDAZOLAM HCL 2 MG/2ML IJ SOLN
INTRAMUSCULAR | Status: AC
  Administered 2022-09-09: 2 mg
  Filled 2022-09-09: qty 2

## 2022-09-09 MED ORDER — DOCUSATE SODIUM 50 MG/5ML PO LIQD
100.0000 mg | Freq: Two times a day (BID) | ORAL | Status: DC
  Administered 2022-09-09 – 2022-09-10 (×4): 100 mg
  Filled 2022-09-09 (×4): qty 10

## 2022-09-09 MED ORDER — PROPOFOL 1000 MG/100ML IV EMUL
0.0000 ug/kg/min | INTRAVENOUS | Status: DC
  Administered 2022-09-09 (×2): 30 ug/kg/min via INTRAVENOUS
  Administered 2022-09-09: 50 ug/kg/min via INTRAVENOUS
  Filled 2022-09-09 (×2): qty 100

## 2022-09-09 MED ORDER — PANTOPRAZOLE 2 MG/ML SUSPENSION
40.0000 mg | Freq: Every day | ORAL | Status: DC
  Administered 2022-09-09 – 2022-09-12 (×4): 40 mg
  Filled 2022-09-09 (×4): qty 20

## 2022-09-09 MED ORDER — MIDAZOLAM BOLUS VIA INFUSION: 0.2000 mg/kg | INTRAVENOUS | Status: DC | PRN

## 2022-09-09 MED ORDER — FENTANYL 2500MCG IN NS 250ML (10MCG/ML) PREMIX INFUSION
0.0000 ug/h | INTRAVENOUS | Status: DC
  Administered 2022-09-09 – 2022-09-11 (×3): 50 ug/h via INTRAVENOUS
  Administered 2022-09-13: 200 ug/h via INTRAVENOUS
  Filled 2022-09-09 (×4): qty 250

## 2022-09-09 MED ORDER — FENTANYL 2500MCG IN NS 250ML (10MCG/ML) PREMIX INFUSION
0.0000 ug/h | INTRAVENOUS | Status: DC
  Filled 2022-09-09: qty 250

## 2022-09-09 MED ORDER — MIDAZOLAM HCL 2 MG/2ML IJ SOLN
2.0000 mg | INTRAMUSCULAR | Status: AC | PRN
  Administered 2022-09-09 (×3): 2 mg via INTRAVENOUS
  Filled 2022-09-09 (×3): qty 2

## 2022-09-09 MED ORDER — DOCUSATE SODIUM 100 MG PO CAPS: 100.0000 mg | ORAL_CAPSULE | Freq: Two times a day (BID) | ORAL | Status: DC | PRN

## 2022-09-09 MED ORDER — MIDAZOLAM-SODIUM CHLORIDE 100-0.9 MG/100ML-% IV SOLN
10.0000 mg/h | INTRAVENOUS | Status: DC
  Administered 2022-09-09: 2 mg/h via INTRAVENOUS
  Administered 2022-09-09 – 2022-09-10 (×2): 10 mg/h via INTRAVENOUS
  Filled 2022-09-09 (×3): qty 100

## 2022-09-09 MED ORDER — ROCURONIUM BROMIDE 50 MG/5ML IV SOLN
INTRAVENOUS | Status: AC | PRN
  Administered 2022-09-09: 80 mg via INTRAVENOUS

## 2022-09-09 MED ORDER — ORAL CARE MOUTH RINSE
15.0000 mL | OROMUCOSAL | Status: DC
  Administered 2022-09-09 – 2022-09-13 (×51): 15 mL via OROMUCOSAL

## 2022-09-09 MED ORDER — MAGNESIUM SULFATE 2 GM/50ML IV SOLN
2.0000 g | Freq: Once | INTRAVENOUS | Status: AC
  Administered 2022-09-09: 2 g via INTRAVENOUS
  Filled 2022-09-09: qty 50

## 2022-09-09 MED ORDER — PROPOFOL BOLUS VIA INFUSION: 2.0000 mg/kg | INTRAVENOUS | Status: DC | PRN

## 2022-09-09 MED ORDER — FENTANYL BOLUS VIA INFUSION
50.0000 ug | INTRAVENOUS | Status: DC | PRN
  Administered 2022-09-13: 100 ug via INTRAVENOUS
  Administered 2022-09-13: 50 ug via INTRAVENOUS
  Administered 2022-09-13 (×4): 100 ug via INTRAVENOUS

## 2022-09-09 MED ORDER — ACETAMINOPHEN 160 MG/5ML PO SOLN: 650.0000 mg | ORAL | Status: DC

## 2022-09-09 MED ORDER — POLYETHYLENE GLYCOL 3350 17 G PO PACK: 17.0000 g | PACK | Freq: Every day | ORAL | Status: DC | PRN

## 2022-09-09 MED ORDER — ETOMIDATE 2 MG/ML IV SOLN: 20.0000 mg | Freq: Once | INTRAVENOUS | Status: DC

## 2022-09-09 MED ORDER — LACTULOSE 10 GM/15ML PO SOLN
10.0000 g | Freq: Three times a day (TID) | ORAL | Status: DC
  Administered 2022-09-09 – 2022-09-11 (×5): 10 g
  Filled 2022-09-09 (×5): qty 15

## 2022-09-09 MED ORDER — CHLORHEXIDINE GLUCONATE CLOTH 2 % EX PADS
6.0000 | MEDICATED_PAD | Freq: Every day | CUTANEOUS | Status: DC
  Administered 2022-09-09 – 2022-09-13 (×7): 6 via TOPICAL

## 2022-09-09 MED ORDER — ALBUTEROL SULFATE (2.5 MG/3ML) 0.083% IN NEBU
5.0000 mg | INHALATION_SOLUTION | Freq: Once | RESPIRATORY_TRACT | Status: AC
  Administered 2022-09-09: 5 mg via RESPIRATORY_TRACT
  Filled 2022-09-09: qty 6

## 2022-09-09 MED ORDER — ETOMIDATE 2 MG/ML IV SOLN
INTRAVENOUS | Status: AC | PRN
  Administered 2022-09-09: 20 mg via INTRAVENOUS

## 2022-09-09 MED ORDER — ASPIRIN 81 MG PO CHEW
81.0000 mg | CHEWABLE_TABLET | Freq: Every day | ORAL | Status: DC
Start: 2022-09-09 — End: 2022-09-13
  Administered 2022-09-09 – 2022-09-12 (×4): 81 mg
  Filled 2022-09-09 (×4): qty 1

## 2022-09-09 MED ORDER — FENTANYL CITRATE PF 50 MCG/ML IJ SOSY: 50.0000 ug | PREFILLED_SYRINGE | Freq: Once | INTRAMUSCULAR | Status: DC

## 2022-09-09 MED ORDER — SODIUM CHLORIDE 0.9 % IV SOLN: 60.0000 mg/kg | Freq: Once | INTRAVENOUS | Status: DC

## 2022-09-09 MED ORDER — ACETAMINOPHEN 160 MG/5ML PO SOLN: 650.0000 mg | ORAL | Status: DC | PRN

## 2022-09-09 MED ORDER — PNEUMOCOCCAL 20-VAL CONJ VACC 0.5 ML IM SUSY: 0.5000 mL | PREFILLED_SYRINGE | INTRAMUSCULAR | Status: DC | PRN

## 2022-09-09 NOTE — Progress Notes (Signed)
eLink Physician-Brief Progress Note Patient Name: Darren Lane DOB: 1961/11/06 MRN: [id number]   Date of Service  09/15/2022  HPI/Events of Note  61/M with history of substance abuse, found unresponseive by his girlfriend. CPR performed, with ROSC after 20 minutes (downtime prior to CPR unknown). Pt was intubated, initial exam reveals pt to be unresponsive, with myoclonic jerks on exam.  eICU Interventions  S/p cardiac arrest - Likely secondary to narcotic overdose.  - Currently on ventilator support    - Maintain on low tidal volume ventilation strategy. RR increased to help correct hypercapnea.       - Titrate FiO2, PEEP to maintain SpO2 >90%    - Serial ABG, will make further vent adjustments as warranted - Head CT negative for acute process. - Pt not a candidate for hypothermia. Goal would be to maintain normothermia and address fever aggressively.  - Plan for echo.  - Will continue to monitor neurologic status.  - Overall prognosis poor.            Kesley Gaffey M DELA CRUZ 08/31/2022, 6:03 AM

## 2022-09-09 NOTE — Plan of Care (Signed)
Problem: Education: Goal: Ability to describe self-care measures that may prevent or decrease complications (Diabetes Survival Skills Education) will improve 09/08/2022 1540 by Joylene Draft, Oda Kilts, RN Outcome: Progressing 09/01/2022 0636 by Mertha Finders, RN Outcome: Progressing Goal: Individualized Educational Video(s) 09/26/2022 [phone] by Mertha Finders, RN Outcome: Progressing 09/23/2022 0636 by Mertha Finders, RN Outcome: Progressing   Problem: Coping: Goal: Ability to adjust to condition or change in health will improve 09/10/2022 0637 by Mertha Finders, RN Outcome: Progressing 08/31/2022 0636 by Mertha Finders, RN Outcome: Progressing   Problem: Fluid Volume: Goal: Ability to maintain a balanced intake and output will improve 09/11/2022 0637 by Mertha Finders, RN Outcome: Progressing 09/29/2022 0636 by Mertha Finders, RN Outcome: Progressing   Problem: Health Behavior/Discharge Planning: Goal: Ability to identify and utilize available resources and services will improve 09/29/2022 0637 by Mertha Finders, RN Outcome: Progressing 09/19/2022 0636 by Mertha Finders, RN Outcome: Progressing Goal: Ability to manage health-related needs will improve 09/07/2022 0637 by Mertha Finders, RN Outcome: Progressing 09/20/2022 0636 by Mertha Finders, RN Outcome: Progressing   Problem: Metabolic: Goal: Ability to maintain appropriate glucose levels will improve 09/04/2022 0637 by Mertha Finders, RN Outcome: Progressing 09/16/2022 0636 by Mertha Finders, RN Outcome: Progressing   Problem: Nutritional: Goal: Maintenance of adequate nutrition will improve 09/01/2022 0637 by Mertha Finders, RN Outcome: Progressing 09/07/2022 0636 by Mertha Finders, RN Outcome: Progressing Goal: Progress toward achieving an  optimal weight will improve 09/10/2022 6195 by Mertha Finders, RN Outcome: Progressing 09/26/2022 0636 by Mertha Finders, RN Outcome: Progressing   Problem: Skin Integrity: Goal: Risk for impaired skin integrity will decrease 09/16/2022 0637 by Mertha Finders, RN Outcome: Progressing 09/25/2022 0636 by Mertha Finders, RN Outcome: Progressing   Problem: Tissue Perfusion: Goal: Adequacy of tissue perfusion will improve 09/08/2022 0637 by Mertha Finders, RN Outcome: Progressing 08/31/2022 0636 by Mertha Finders, RN Outcome: Progressing   Problem: Education: Goal: Knowledge of General Education information will improve Description: Including pain rating scale, medication(s)/side effects and non-pharmacologic comfort measures 09/20/2022 0637 by Joylene Draft, Oda Kilts, RN Outcome: Progressing 09/15/2022 0636 by Mertha Finders, RN Outcome: Progressing   Problem: Health Behavior/Discharge Planning: Goal: Ability to manage health-related needs will improve 09/17/2022 0637 by Mertha Finders, RN Outcome: Progressing 09/17/2022 0636 by Mertha Finders, RN Outcome: Progressing   Problem: Clinical Measurements: Goal: Ability to maintain clinical measurements within normal limits will improve 09/01/2022 0637 by Joylene Draft, Oda Kilts, RN Outcome: Progressing 09/01/2022 0636 by Mertha Finders, RN Outcome: Progressing Goal: Will remain free from infection 09/01/2022 0637 by Mertha Finders, RN Outcome: Progressing 09/18/2022 0636 by Mertha Finders, RN Outcome: Progressing Goal: Diagnostic test results will improve 09/20/2022 0637 by Mertha Finders, RN Outcome: Progressing 09/11/2022 0636 by Mertha Finders, RN Outcome: Progressing Goal: Respiratory complications will improve 09/06/2022 0637 by Mertha Finders,  RN Outcome: Progressing 09/08/2022 0636 by Mertha Finders, RN Outcome: Progressing Goal: Cardiovascular complication will be avoided 09/24/2022 0932 by Mertha Finders, RN Outcome: Progressing 09/16/2022 0636 by Mertha Finders, RN Outcome: Progressing   Problem: Activity: Goal: Risk for activity intolerance will decrease 09/11/2022 0637 by Joylene Draft, Oda Kilts, RN Outcome: Progressing 09/21/2022  6712 by Joylene Draft, Oda Kilts, RN Outcome: Progressing   Problem: Nutrition: Goal: Adequate nutrition will be maintained 09/12/2022 4580 by Mertha Finders, RN Outcome: Progressing 09/13/2022 0636 by Mertha Finders, RN Outcome: Progressing   Problem: Coping: Goal: Level of anxiety will decrease 09/29/2022 0637 by Mertha Finders, RN Outcome: Progressing 09/29/2022 0636 by Mertha Finders, RN Outcome: Progressing   Problem: Elimination: Goal: Will not experience complications related to bowel motility 09/04/2022 0637 by Mertha Finders, RN Outcome: Progressing 09/21/2022 0636 by Mertha Finders, RN Outcome: Progressing Goal: Will not experience complications related to urinary retention 09/15/2022 9983 by Mertha Finders, RN Outcome: Progressing 09/27/2022 0636 by Mertha Finders, RN Outcome: Progressing   Problem: Pain Managment: Goal: General experience of comfort will improve 09/21/2022 0637 by Mertha Finders, RN Outcome: Progressing 09/21/2022 0636 by Mertha Finders, RN Outcome: Progressing   Problem: Safety: Goal: Ability to remain free from injury will improve 09/21/2022 0637 by Mertha Finders, RN Outcome: Progressing 09/08/2022 0636 by Mertha Finders, RN Outcome: Progressing   Problem: Skin Integrity: Goal: Risk for impaired skin integrity will decrease 09/25/2022 0637 by Mertha Finders,  RN Outcome: Progressing 09/13/2022 0636 by Mertha Finders, RN Outcome: Progressing   Problem: Education: Goal: Ability to manage disease process will improve 09/02/2022 0637 by Mertha Finders, RN Outcome: Progressing 09/13/2022 0636 by Mertha Finders, RN Outcome: Progressing   Problem: Cardiac: Goal: Ability to achieve and maintain adequate cardiopulmonary perfusion will improve 09/13/2022 0637 by Mertha Finders, RN Outcome: Progressing 09/02/2022 0636 by Mertha Finders, RN Outcome: Progressing   Problem: Neurologic: Goal: Promote progressive neurologic recovery 09/20/2022 [phone] by Mertha Finders, RN Outcome: Progressing 09/06/2022 0636 by Mertha Finders, RN Outcome: Progressing   Problem: Skin Integrity: Goal: Risk for impaired skin integrity will be minimized. Outcome: Progressing   Problem: Education: Goal: Knowledge of General Education information will improve Description: Including pain rating scale, medication(s)/side effects and non-pharmacologic comfort measures Outcome: Progressing   Problem: Health Behavior/Discharge Planning: Goal: Ability to manage health-related needs will improve Outcome: Progressing   Problem: Clinical Measurements: Goal: Ability to maintain clinical measurements within normal limits will improve Outcome: Progressing Goal: Will remain free from infection Outcome: Progressing Goal: Diagnostic test results will improve Outcome: Progressing Goal: Respiratory complications will improve Outcome: Progressing Goal: Cardiovascular complication will be avoided Outcome: Progressing   Problem: Activity: Goal: Risk for activity intolerance will decrease Outcome: Progressing   Problem: Nutrition: Goal: Adequate nutrition will be maintained Outcome: Progressing   Problem: Coping: Goal: Level of anxiety will decrease Outcome: Progressing   Problem:  Elimination: Goal: Will not experience complications related to bowel motility Outcome: Progressing Goal: Will not experience complications related to urinary retention Outcome: Progressing   Problem: Pain Managment: Goal: General experience of comfort will improve Outcome: Progressing   Problem: Safety: Goal: Ability to remain free from injury will improve Outcome: Progressing   Problem: Skin Integrity: Goal: Risk for impaired skin integrity will decrease Outcome: Progressing

## 2022-09-09 NOTE — Procedures (Signed)
Patient Name: Darren Lane  MRN: [id number]  Epilepsy Attending: Charlsie Quest  Referring Physician/Provider: Lidia Collum, PA-C  Date: Oct 03, 2022 Duration: 22.12 mins  Patient history: 61yo M s/p cardiac arrest. EEG to evaluate for seizure  Level of alertness: comatose  AEDs during EEG study: propofol  Technical aspects: This EEG study was done with scalp electrodes positioned according to the 10-20 International system of electrode placement. Electrical activity was reviewed with band pass filter of 1-70Hz , sensitivity of 7 uV/mm, display speed of 24mm/sec with a 60Hz  notched filter applied as appropriate. EEG data were recorded continuously and digitally stored.  Video monitoring was available and reviewed as appropriate.  Description: Patient was noted to have episodes of sudden eye opening with whole body jerking every 1-2 minutes lasting 2-10 seconds. Concomitant EEG showed generalized polyspikes consistent with myoclonic seizures.  In between seizures EEG showed generalized background suppression.  Hyperventilation and photic stimulation were not performed.     ABNORMALITY - Myoclonic seizures, generalized - Background suppression, generalized  IMPRESSION: Patient was noted to have myoclonic seizures every 1-2 minutes lasting 2-10 seconds as described above. Additionally there was profound diffuse encephalopathy.  In the setting of cardiac arrest, this EEG pattern is suggestive of anoxic/hypoxic brain injury.  Dr was notified   Josilyn Shippee Chestine Spore

## 2022-09-09 NOTE — Progress Notes (Signed)
Can NOT get pt/ online, STAT request is put through with biomed to get pt back online

## 2022-09-09 NOTE — H&P (Addendum)
NAME:  Darren Lane, MRN:  [id number], DOB:  03/26/1961, LOS: 0 ADMISSION DATE:  09/29/2022, CONSULTATION DATE:  9/10 REFERRING MD:  Dr. Nicanor Alcon, CHIEF COMPLAINT:  post arrest   History of Present Illness:  Patient is a 61 year old male with pertinent PMH of asthma and polysubstance abuse presents to [facility] ED on 9/10 post arrest.  On 9/10, patient was reportedly found down by a girlfriend who states patient was found patient down unresponsive and pulseless. Reports that he was using fentanyl earlier. Unwitnessed arrest. EMS called and found patient pulseless and asystole. Powder appreciated on floor on scene.  CPR started and [facility] airway placed.  Epi given.  ROSC about 20 minutes.  Patient transferred to [facility] ED.  Upon arrival to [facility] ED on 9/10 patient with [facility] airway.  UDS was obtained prior to giving RSI medications for ETT exchange.  Patient initial BP 189/108, HR 107.  Breathing spontaneously but diminished wheezing present.  GCS 1.  CXR appears clear with ETT in good position.  CT head with no acute abnormality.  EKG with no elevated ST.  LA 7, K5.2, glucose 229, AST 148, ALT 121.  Salicylate, acetaminophen, ethanol WNL.  UDS positive for cocaine and opiates.  WBC 14.1.  Troponin 31.  ABG 7.10, 81, 510, 26.  PCCM consulted for ICU admission.  Pertinent  Medical History  polysubstance abuse Asthma Retinal detachment of right eye  Significant Hospital Events: Including procedures, antibiotic start and stop dates in addition to other pertinent events   9/10: post arrest; unknown down time; found with fentanyl and cocaine in UDS; EMS called found pulseless in asystole; king airway placed and performed CPR for about 20 minutes until rosc. Admitted to [facility].  Interim History / Subjective:  See above  Objective   Blood pressure (!) 165/101, pulse (!) 106, temperature (!) 96 F (35.6 C), resp. rate 20, height 5\' 9"  (1.753 m), weight 73 kg, SpO2 100 %.    Vent Mode: PRVC FiO2 (%):  [40 %-100 %]  40 % Set Rate:  [20 bmp-24 bmp] 24 bmp Vt Set:  [560 mL] 560 mL PEEP:  [5 cmH20] 5 cmH20  No intake or output data in the 24 hours ending 09/04/2022 0352 Filed Weights   09/23/2022 0245  Weight: 73 kg    Examination: General:  critically ill appearing on mech vent HEENT: MM pink/moist; ETT in place Neuro: sedated w/ fentanyl; non responsive to painful stimuli; no cough/gag; no corneal; pupils 8mm sluggish to light CV: s1s2, no m/r/g PULM:  dim clear BS bilaterally; on mech vent PRVC GI: soft, bsx4 active  Extremities: warm/dry, no edema  Skin: no rashes or lesions appreciated   Resolved Hospital Problem list     Assessment & Plan:  Cardiac Arrest: Unwitnessed arrest; found unresponsive, apneic, pulses were absent, found to be in asystole; CPR started and [facility] airway placed, given epi.  UDS positive for opiates and cocaine (obtained prior to starting fentanyl) -CT head with no acute abnormality; UDS positive for cocaine/opiates P: -will admit to ICU w/ continuous telemetry monitoring -trend troponin and lactate -increase RR due to resp acidosis; repeat ABG in few hours -check Mag: replete electrolytes as needed -echo -EEG -f/u bcx2, UA -TTM normothermia protocol in place -consider MRI 72 hours post arrest  Acute respiratory failure with hypercarbia Asthma exacerbation P: -LTVV strategy with tidal volumes of 6-8 cc/kg ideal body weight -increased RR per abg results; repeat abg in few hours -Goal plateau pressures less than 30  and driving pressures less than 15 -Wean PEEP/FiO2 for SpO2 >92% -VAP bundle in place -Daily SAT and SBT -PAD protocol in place -wean sedation for RASS goal 0 to -1 -Follow intermittent CXR and ABG PRN -will start on iv steroids -duoneb q4 -give mag  Acute encephalopathy Polysubstance abuse Possible myoclonus -Postarrest unknown downtime; likely hypercarbia related from drug overdose suppressing respirations; UDS positive for  cocaine/opiates -CT head with no acute abnormality; ethanol, salicylate, acetaminophen level WNL P: -increased RR due to hypercarbia; repeat abg in few hours -limit sedating meds -EEG; consider neuro consult in am -consider MRI in 72 hours post arrest -sedation for RASS 0 to -1 -versed for possible myoclonus -check ammonia  Leukocytosis -Likely reactive P: -Trend WBC/fever curve -f/u BC x2, UA -Trend LA  Hyperglycemia P: -SSI and CBG monitoring -Check A1c  Mild hyperkalemia P: -lokelma given -trend cmp  Transaminitis P: -likely shock liver -trend cmp -avoid hepatotoxic agents   Best Practice (right click and "Reselect all SmartList Selections" daily)   Diet/type: NPO w/ meds via tube DVT prophylaxis: prophylactic heparin  GI prophylaxis: PPI Lines: N/A Foley:  Yes, and it is still needed Code Status:  full code Last date of multidisciplinary goals of care discussion [spoke with sister debbie over phone and updated; Unable to reach pam over phone]  Labs   CBC: Recent Labs  Lab September 21, 2022 0237 September 21, 2022 0323  WBC 14.1*  --   NEUTROABS 7.1  --   HGB 14.0 16.0  HCT 44.4 47.0  MCV 96.1  --   PLT 281  --     Basic Metabolic Panel: Recent Labs  Lab 09-21-2022 0323  NA 138  K 4.7   GFR: CrCl cannot be calculated (Patient's most recent lab result is older than the maximum 21 days allowed.). Recent Labs  Lab Sep 21, 2022 0237  WBC 14.1*    Liver Function Tests: No results for input(s): "AST", "ALT", "ALKPHOS", "BILITOT", "PROT", "ALBUMIN" in the last 168 hours. No results for input(s): "LIPASE", "AMYLASE" in the last 168 hours. No results for input(s): "AMMONIA" in the last 168 hours.  ABG    Component Value Date/Time   PHART 7.105 (LL) 09-21-2022 0323   PCO2ART 81.6 (HH) 09-21-22 0323   PO2ART 510 (H) 2022/09/21 0323   HCO3 26.1 2022-09-21 0323   TCO2 29 September 21, 2022 0323   ACIDBASEDEF 6.0 (H) 21-Sep-2022 0323   O2SAT 100 09/21/2022 0323      Coagulation Profile: No results for input(s): "INR", "PROTIME" in the last 168 hours.  Cardiac Enzymes: No results for input(s): "CKTOTAL", "CKMB", "CKMBINDEX", "TROPONINI" in the last 168 hours.  HbA1C: Hgb A1c MFr Bld  Date/Time Value Ref Range Status  01/09/2017 02:28 AM 6.1 (H) 4.8 - 5.6 % Final    Comment:    (NOTE)         Pre-diabetes: 5.7 - 6.4         Diabetes: >6.4         Glycemic control for adults with diabetes: <7.0     CBG: Recent Labs  Lab September 21, 2022 0255  GLUCAP 181*    Review of Systems:   Patient is encephalopathic and/or intubated. Therefore history has been obtained from chart review.    Past Medical History:  He,  has a past medical history of Asthma.   Surgical History:   Past Surgical History:  Procedure Laterality Date   GAS INSERTION Right 10/24/2017   [facility]  Surgeon: Carmela Rima, MD;  Location:  [facility] OR;  Service: Ophthalmology;  Laterality: Right;   PARS PLANA VITRECTOMY Right 10/24/2017   Procedure: PARS PLANA VITRECTOMY WITH 25 GAUGE, ENDO LASER;  Surgeon: Carmela Rima, MD;  Location: [facility] OR;  Service: Ophthalmology;  Laterality: Right;   REPAIR OF COMPLEX TRACTION RETINAL DETACHMENT Right 10/24/2017   Procedure: REPAIR OF COMPLEX TRACTION RETINAL DETACHMENT;  Surgeon: Carmela Rima, MD;  Location: [facility] OR;  Service: Ophthalmology;  Laterality: Right;     Social History:   reports that he has been smoking cigarettes. He has a 30.00 pack-year smoking history. He has never used smokeless tobacco. He reports current alcohol use. He reports that he does not use drugs.   Family History:  His family history is negative for Asthma and Diabetes Mellitus II.   Allergies No Known Allergies   Home Medications  Prior to Admission medications   Medication Sig Start Date End Date Taking? Authorizing Provider  aspirin EC 81 MG tablet Take 81 mg by mouth daily.    [provider]     Critical care  time: 45 minutes    JD Anselm Lis [facility] 10/03/22, 3:52 AM  Please see [email or web address] for pager details.  From 7A-7P if no response, please call [phone]. After hours, please call ELink [phone].

## 2022-09-09 NOTE — Progress Notes (Signed)
PHARMACY - PHYSICIAN COMMUNICATION CRITICAL VALUE ALERT - BLOOD CULTURE IDENTIFICATION (BCID)  Darren Lane is an 61 y.o. male who presented to [facility] on 09/23/2022 with a chief complaint of unresponsive and pulseless  Assessment:  1 out of 4 blood cultures positive for staph and strep  Name of physician (or Provider) Contacted: Dr. Chestine Spore  Current antibiotics: None  Changes to prescribed antibiotics recommended: Start Rocephin 2 gms IV q24 hrs Recommendations accepted by provider  Results for orders placed or performed during the hospital encounter of 09/21/2022  Blood Culture ID Panel (Reflexed) (Collected: 09/12/2022  2:37 AM)  Result Value Ref Range   Enterococcus faecalis NOT DETECTED NOT DETECTED   Enterococcus Faecium NOT DETECTED NOT DETECTED   Listeria monocytogenes NOT DETECTED NOT DETECTED   Staphylococcus species DETECTED (A) NOT DETECTED   Staphylococcus aureus (BCID) NOT DETECTED NOT DETECTED   Staphylococcus epidermidis NOT DETECTED NOT DETECTED   Staphylococcus lugdunensis NOT DETECTED NOT DETECTED   Streptococcus species DETECTED (A) NOT DETECTED   Streptococcus agalactiae NOT DETECTED NOT DETECTED   Streptococcus pneumoniae NOT DETECTED NOT DETECTED   Streptococcus pyogenes NOT DETECTED NOT DETECTED   A.calcoaceticus-baumannii NOT DETECTED NOT DETECTED   Bacteroides fragilis NOT DETECTED NOT DETECTED   Enterobacterales NOT DETECTED NOT DETECTED   Enterobacter cloacae complex NOT DETECTED NOT DETECTED   Escherichia coli NOT DETECTED NOT DETECTED   Klebsiella aerogenes NOT DETECTED NOT DETECTED   Klebsiella oxytoca NOT DETECTED NOT DETECTED   Klebsiella pneumoniae NOT DETECTED NOT DETECTED   Proteus species NOT DETECTED NOT DETECTED   Salmonella species NOT DETECTED NOT DETECTED   Serratia marcescens NOT DETECTED NOT DETECTED   Haemophilus influenzae NOT DETECTED NOT DETECTED   Neisseria meningitidis NOT DETECTED NOT DETECTED   Pseudomonas aeruginosa NOT  DETECTED NOT DETECTED   Stenotrophomonas maltophilia NOT DETECTED NOT DETECTED   Candida albicans NOT DETECTED NOT DETECTED   Candida auris NOT DETECTED NOT DETECTED   Candida glabrata NOT DETECTED NOT DETECTED   Candida krusei NOT DETECTED NOT DETECTED   Candida parapsilosis NOT DETECTED NOT DETECTED   Candida tropicalis NOT DETECTED NOT DETECTED   Cryptococcus neoformans/gattii NOT DETECTED NOT DETECTED    Tera Mater 09/11/2022  6:19 PM

## 2022-09-09 NOTE — ED Provider Notes (Signed)
Please see Dr. Sharen Hones note for full H&P.  Attending present @ bedside during intubation.   Procedures  Procedure Name: Intubation Date/Time: 09/04/2022 2:27 AM  Performed by: Cherly Anderson, PA-CPre-anesthesia Checklist: Patient identified, Patient being monitored, Emergency Drugs available and Suction available Oxygen Delivery Method: Ambu bag Preoxygenation: Pre-oxygenation with 100% oxygen Induction Type: Rapid sequence Ventilation: Mask ventilation without difficulty Laryngoscope Size: Glidescope Tube size: 7.5 mm Number of attempts: 1 Airway Equipment and Method: Rigid stylet Placement Confirmation: ETT inserted through vocal cords under direct vision, CO2 detector and Breath sounds checked- equal and bilateral Secured at: 26 cm Tube secured with: ETT holder Dental Injury: Teeth and Oropharynx as per pre-operative assessment           Cherly Anderson, PA-C 09/04/2022 0316    Palumbo, April, MD 09/18/2022 0258

## 2022-09-09 NOTE — Progress Notes (Signed)
LTM EEG hooked up and running - no initial skin breakdown - push button tested - neuro notified. [facility] monitoring.  

## 2022-09-09 NOTE — Progress Notes (Signed)
S: Patient without any clinical myoclonic jerking this shift.   O; BP (!) 85/61   Pulse 83   Temp 98.6 F (37 C)   Resp (!) 30   Ht 5\' 9"  (1.753 m)   Wt 88.3 kg   SpO2 92%   BMI 28.75 kg/m   Exam: Intubated and sedated on propofol and Versed. No spontaneous movements.   EEG reviewed at the bedside at 11:10 PM. There is diffuse background suppression. A few low amplitude spikes were seen at the threshold of resolution of the EEG tracings, on average less than 1 every 10 seconds.   A/R: Myoclonus in a patient with probable anoxic brain injury.  - Electrographic myoclonus now effectively suppressed based on EEG.  - No clinical myoclonic activity seen on exam.  - Continue Versed and propofol drips at current rates.  - Continue scheduled Keppra at 1500 mg IV BID - Continue LTM EEG monitoring  15 minutes of critical care time.   Electronically signed: Dr. 

## 2022-09-09 NOTE — ED Triage Notes (Signed)
Patient found at home, apneic and pulseless at home by girlfriend after using fentanyl.  Patient with unknown downtime.  ROSC obtained after 6 rounds of CPR, 2 epi pushes and arrives to ED on epi drip.  Patient was given 2.5mg  versed for biting at [facility] Air[facility] by [facility].  Per EMS, large amount of white powder in apartment.  [employer] confirmed white powder was fentanyl.

## 2022-09-09 NOTE — IPAL (Signed)
  Interdisciplinary Goals of Care Family Meeting   Date carried out: October 07, 2022  Location of the meeting: Bedside  Member's involved: Physician, Bedside Registered Nurse, and Family Member or next of kin  Durable Power of Attorney or acting medical decision maker: wife Darren Lane and her niece    Discussion: We discussed goals of care for Darren Lane .  They understand he is severely ill and showing signs of a significant anoxic brain injury. We reviewed that we cannot prognosticate fully until 72 hrs post-arrest, but so far his physical exam and EEG findings are concerning. It is possible he may never regain consciousness. We reviewed that in the event of a recurrent arrest additional resuscitation would likely be futile. I recommended changing code status and continuing supportive care measures. He has never indicated what he would want in a situation where he required life support. They will discuss and will let us know if they decide they are ok with changing code status.  Code status: Full Code  Disposition: Continue current acute care  Time spent for the meeting: 15 min.    Steffanie Dunn, DO  10/07/22, 12:30 PM

## 2022-09-09 NOTE — Progress Notes (Signed)
Pt transported pt from ED resusc to 2H12 on ventilator with no complications. RN at bedside for transport.

## 2022-09-09 NOTE — Consult Note (Signed)
Neurology Consultation Reason for Consult: Status myoclonus s/p cardiac arrest  Requesting Physician: Karie Fetch  CC: Cardiac arrest  History is obtained from: Family at bedside and chart review  HPI: Darren Lane is a 61 y.o. male with a past medical history significant for asthma, polysubstance abuse (including cocaine, tobacco, alcohol and fentanyl), right retinal detachment.  He had been using narcotics with his long-term girlfriend of 20 years.  When she awoke she found he was unresponsive and pulseless, therefore downtime is unknown.  After EMS arrival he was found to be pulseless and in asystole.  ROSC was obtained after 20 minutes of resuscitation.  He was intubated due to unresponsiveness, GCS of 3, though he was still breathing spontaneously, initial gas was notable for severe acidosis which has improved with supportive care  Premorbid modified rankin scale: 0-1  ROS: Unable to obtain due to altered mental status.   Past Medical History:  Diagnosis Date   Asthma    Past Surgical History:  Procedure Laterality Date   GAS INSERTION Right 10/24/2017   Procedure: INSERTION OF GAS( SF6);  Surgeon: Carmela Rima, MD;  Location: [facility] OR;  Service: Ophthalmology;  Laterality: Right;   PARS PLANA VITRECTOMY Right 10/24/2017   Procedure: PARS PLANA VITRECTOMY WITH 25 GAUGE, ENDO LASER;  Surgeon: Carmela Rima, MD;  Location: [facility] OR;  Service: Ophthalmology;  Laterality: Right;   REPAIR OF COMPLEX TRACTION RETINAL DETACHMENT Right 10/24/2017   Procedure: REPAIR OF COMPLEX TRACTION RETINAL DETACHMENT;  Surgeon: Carmela Rima, MD;  Location: [facility] OR;  Service: Ophthalmology;  Laterality: Right;   Current Outpatient Medications  Medication Instructions   aspirin EC 81 mg, Oral, Daily     Family History  Problem Relation Age of Onset   Asthma Neg Hx    Diabetes Mellitus II Neg Hx     Social History:  reports that he has been smoking cigarettes. He has a 30.00 pack-year smoking  history. He has never used smokeless tobacco. He reports current alcohol use. He reports that he does not use drugs.  Exam: Current vital signs: BP 131/75   Pulse 90   Temp 98.2 F (36.8 C)   Resp (!) 46   Ht 5\' 9"  (1.753 m)   Wt 88.3 kg   SpO2 98%   BMI 28.75 kg/m  Vital signs in last 24 hours: Temp:  [95.7 F (35.4 C)-100.2 F (37.9 C)] 98.2 F (36.8 C) (09/10 1100) Pulse Rate:  [73-114] 90 (09/10 1130) Resp:  [13-46] 46 (09/10 1130) BP: (121-189)/(75-116) 131/75 (09/10 1130) SpO2:  [96 %-100 %] 98 % (09/10 1130) FiO2 (%):  [40 %-100 %] 40 % (09/10 1130) Weight:  [73 kg-88.3 kg] 88.3 kg (09/10 0654)   Physical Exam  Constitutional: Appears ill Psych: Not responsive Eyes: No scleral injection, mild scleral edema HENT: ET tube in place MSK: no obvious deformities.  Cardiovascular: Normal rate and regular rhythm. Perfusing extremities well Respiratory: Intermittently storming on the ventilator GI: Soft.  No distension.  Skin: Warm dry and intact visible skin  Neuro:  Intermittent full body myoclonic jerking with eyes opening and upgaze deviation  Mental Status: Does not open eyes other than during myoclonic jerking Does not follow any commands Cranial Nerves: II: No blink to threat. Pupils are equal, round, and reactive to light 2 mm to 1 mm.   III,IV, VI/VIII: VOR not assessed V/VII: No response to corneal stimulation VIII: No response to voice X/XI: Weak cough XII: Unable to assess tongue protrusion secondary to  patient's mental status  Motor/Sensory: Tone is normal. Bulk is normal.  No movement to noxious stim in any of the extremities    I have reviewed labs in epic and the results pertinent to this consultation are:  Basic Metabolic Panel: Recent Labs  Lab 2022/10/03 0237 Oct 03, 2022 0323 10-03-2022 0405 Oct 03, 2022 0526 10/03/2022 0623 2022/10/03 1001  NA 138 138 138  --  138 140  K 5.2* 4.7 4.5  --  3.9 4.0  CL 107  --   --   --   --  107  CO2 18*  --    --   --   --  22  GLUCOSE 229*  --   --   --   --  172*  BUN 18  --   --   --   --  21*  CREATININE 1.19  --   --  0.72  --  0.99  CALCIUM 8.4*  --   --   --   --  8.4*  MG  --   --   --   --   --  2.2   Lab Results  Component Value Date   ALT 121 (H) 2022-10-03   AST 148 (H) October 03, 2022   ALKPHOS 59 2022-10-03   BILITOT 0.3 October 03, 2022    CBC: Recent Labs  Lab October 03, 2022 0237 2022-10-03 0323 10-03-22 0405 Oct 03, 2022 0526 10-03-22 0623  WBC 14.1*  --   --  15.3*  --   NEUTROABS 7.1  --   --   --   --   HGB 14.0 16.0 15.6 13.2 15.6  HCT 44.4 47.0 46.0 40.9 46.0  MCV 96.1  --   --  94.2  --   PLT 281  --   --  105*  --     Latest Reference Range & Units Oct 03, 2022 03:23 10/03/2022 04:05 10/03/22 06:23  Sample type  ARTERIAL ARTERIAL ARTERIAL  pH, Arterial 7.35 - 7.45  7.105 (LL) 7.201 (L) 7.329 (L)  pCO2 arterial 32 - 48 mmHg 81.6 (HH) 62.8 (H) 43.6  pO2, Arterial 83 - 108 mmHg 510 (H) 81 (L) 105  TCO2 22 - 32 mmol/L 29 27 24   Acid-base deficit 0.0 - 2.0 mmol/L 6.0 (H) 5.0 (H) 3.0 (H)  Bicarbonate 20.0 - 28.0 mmol/L 26.1 25.1 22.9  O2 Saturation % 100 94 98  Patient temperature  96.2 F 96.2 F 37.1 C  Collection site  RADIAL, ALLEN'S TEST ACCEPTABLE RADIAL, ALLEN'S TEST ACCEPTABLE RADIAL, ALLEN'S TEST ACCEPTABLE  (LL): Data is critically low (HH): Data is critically high (L): Data is abnormally low (H): Data is abnormally high  Coagulation Studies: No results for input(s): "LABPROT", "INR" in the last 72 hours.    I have reviewed the images obtained:  Head CT personally reviewed, agree with radiology no acute intracranial process  ECHO:  1. Very limited study due to very poor echo windows.   2. Left ventricular ejection fraction, by estimation, is 55 to 60%. The  left ventricle has normal function. Left ventricular endocardial border  not optimally defined to evaluate regional wall motion. Left ventricular  diastolic parameters are consistent  with Grade I diastolic  dysfunction (impaired relaxation).   3. The right ventricule is not well visualized but grossly the systolic  function appears normal. S' 15. Right ventricular systolic function was  not well visualized. The right ventricular size is not well visualized.   4. The mitral valve is grossly normal. No evidence of mitral valve  regurgitation.   5. The aortic valve was not well visualized. Aortic valve regurgitation  is not visualized. No aortic stenosis is present.   Impression: History and exam serial examination as well as EEG findings highly concerning for severe anoxic injury.  Discussed with family that brain swelling not seen on his initial scan but expected to peak in 3 to 5 days.  Discussed that I am in agreement with CCM team and cardiology that further resuscitation would not be helpful if he were to experience another cardiac arrest given this would result in additional brain injury on top of what he has already suffered.  We discussed that formal neurologic prognostication is typically done in 3 to 5 days to allow for peak swelling to complete and allow for any recovery that may occur, and that I am very concerned that he may progress to having enough swelling that he passes away even with all measures taken.  Family inquired about transfer to neuro ICU and I discussed that given his presenting condition was cardiac arrest the cardiac ICU was the best location for him and that neurology would be following along  Recommendations: - Keppra status load 4500 mg IV (60 mg/kg but max dose is 4500 mg) - Agree with propofol and versed - Versed bolus ordered for seizure control and drip increased to 10 mg/hr (0.1 to 2 mg/kg/h is a standard dosing for status control) - LTM EEG in place, neurology will monitor - Neurology will continue to follow, appreciate CCM and cardiology comanagement  Lesleigh Noe MD-PhD [facility] [phone] Available 7 AM to 7 PM, outside these hours please  contact Neurologist on call listed on AMION   Total critical care time: 45 minutes   Critical care time was exclusive of separately billable procedures and treating other patients.   Critical care was necessary to treat or prevent imminent or life-threatening deterioration.   Critical care was time spent personally by me on the following activities: development of treatment plan with patient and/or surrogate as well as nursing, discussions with consultants/primary team, evaluation of patient's response to treatment, examination of patient, obtaining history from patient or surrogate, ordering and performing treatments and interventions, ordering and review of laboratory studies, ordering and review of radiographic studies, and re-evaluation of patient's condition as needed, as documented above.

## 2022-09-09 NOTE — Progress Notes (Signed)
NAME:  Darren Lane, MRN:  [id number], DOB:  [birth year], LOS: 0 ADMISSION DATE:  09/21/2022, CONSULTATION DATE:  9/10 REFERRING MD:  Dr. Nicanor Alcon, CHIEF COMPLAINT:  post arrest   History of Present Illness:  Patient is a 61 year old male with pertinent PMH of asthma and polysubstance abuse presents to [facility] ED on 9/10 post arrest.  On 9/10, patient was reportedly found down by a girlfriend who states patient was found patient down unresponsive and pulseless. Reports that he was using fentanyl earlier. Unwitnessed arrest. EMS called and found patient pulseless and asystole. Powder appreciated on floor on scene.  CPR started and [facility] airway placed.  Epi given.  ROSC about 20 minutes.  Patient transferred to [facility] ED.  Upon arrival to [facility] ED on 9/10 patient with [facility] airway.  UDS was obtained prior to giving RSI medications for ETT exchange.  Patient initial BP 189/108, HR 107.  Breathing spontaneously but diminished wheezing present.  GCS 1.  CXR appears clear with ETT in good position.  CT head with no acute abnormality.  EKG with no elevated ST.  LA 7, K5.2, glucose 229, AST 148, ALT 121.  Salicylate, acetaminophen, ethanol WNL.  UDS positive for cocaine and opiates.  WBC 14.1.  Troponin 31.  ABG 7.10, 81, 510, 26.  PCCM consulted for ICU admission.  Pertinent  Medical History  polysubstance abuse Asthma Retinal detachment of right eye  Significant Hospital Events: Including procedures, antibiotic start and stop dates in addition to other pertinent events   9/10: post arrest; unknown down time; found with fentanyl and cocaine in UDS; EMS called found pulseless in asystole; [facility] airway placed and performed CPR for about 20 minutes until rosc. Admitted to [facility].  Interim History / Subjective:  Ongoing myoclonus, increased sedation.   Objective   Blood pressure 126/82, pulse 73, temperature 98.1 F (36.7 C), temperature source Oral, resp. rate (!) 30, height 5\' 9"  (1.753 m), weight 88.3 kg, SpO2 99  %.    Vent Mode: PRVC FiO2 (%):  [40 %-100 %] 40 % Set Rate:  [20 bmp-30 bmp] 30 bmp Vt Set:  [560 mL] 560 mL PEEP:  [5 cmH20] 5 cmH20 Plateau Pressure:  [14 cmH20] 14 cmH20   Intake/Output Summary (Last 24 hours) at 09/19/2022 0725 Last data filed at 09/03/2022 0533 Gross per 24 hour  Intake --  Output 700 ml  Net -700 ml   Filed Weights   09/06/2022 0245 08/31/2022 0654  Weight: 73 kg 88.3 kg    Examination: General:  critically ill appearing man lying in bed in NAD HEENT: [place]/AT, eyes anicteric, ETT in place Neuro: sedated on fent and propofol, severely tachypneic in upper 40s. Periodic myoclonus. Small but reactive pupils, no corneal reflex or dolls eyes.  No pharyngeal gag, intact tracheal cough. Flexion posturing periodically. Withdrawing to pain in extremities.  CV: s1S2, tachycardic PULM:  no tracheal secretions, CTAB, very tachypneic GI:  soft, NT Extremities: no peripheral edema, no cyanosis Skin: warm, dry  7.33/44/105/23 WBC 15.3 Bicarb 22 BUN 21 Cr 0.99 LA 1.8> 4.1  Myoclonoic seizures every 1-2 min , profound diffuse encephalopathy suggestive of severe anoxic brain injury  Resolved Hospital Problem list     Assessment & Plan:  Cardiac Arrest: Unwitnessed arrest; found unresponsive, apneic, pulses were absent, found to be in asystole; CPR started and [facility] airway placed, given epi.  UDS positive for opiates and cocaine (obtained prior to starting fentanyl) -CT head with no acute abnormality; UDS positive for cocaine/opiates  P: -con't supportive care -tele monitoring -con't TTM for normothermia -increasing sedation due to myoclonus  Acute respiratory failure with hypercarbia and hypoxia on MV Asthma exacerbation P: -LTVV -VAP prevention protocol -PAD protocol for sedation -daily SAT & SBT as appropriate; not stable for this yet due to myoclonus -wean vent as able to maintain SpO2 -duonebs Q4h -con't high dose steroids   Likely strep bacteremia vs  contaminant -empiric ceftriaxone -con't to follow cultures -can recheck cultures tomorrow  Acute encephalopathy Polysubstance abuse Myoclonic seizures -Postarrest unknown downtime; likely hypercarbia related from drug overdose suppressing respirations; UDS positive for cocaine/opiates -CT head with no acute abnormality; ethanol, salicylate, acetaminophen level WNL P: -repeat ABG appropriate -neuro consult- appreciate their recommendations -Keppra & versed load per their recommendaitons; con't propofol -EEG -lactulose for hyperammonemia  Lactic acidosis, likely worsened again due to seizures -recheck in AM -seizure suppression  Hyperglycemia, prediabetes (A1c 6.4) P: -SSI PRN -goal BG 140-180  Mild hyperkalemia, resolved P: -monitor  Transaminase elevation likely 2/2 shock liver hyperammonemia P: -ammonia -avoid hepatotoxic meds  Thrombocytopenia likely 2/2 acute illness -monitor -no current indication for transfusion  History of CVA -aspirin, hold statin until liver function improves  See separate ipal note.  Best Practice (right click and "Reselect all SmartList Selections" daily)   Diet/type: NPO w/ meds via tube DVT prophylaxis: prophylactic heparin  GI prophylaxis: PPI Lines: N/A Foley:  Yes, and it is still needed Code Status:  full code Last date of multidisciplinary goals of care discussion [9/10- con't full code, discussing code status]  Labs   CBC: Recent Labs  Lab 09/28/2022 0237 09/12/2022 0323 09/10/2022 0405 09/17/2022 0526 09/15/2022 0623  WBC 14.1*  --   --  15.3*  --   NEUTROABS 7.1  --   --   --   --   HGB 14.0 16.0 15.6 13.2 15.6  HCT 44.4 47.0 46.0 40.9 46.0  MCV 96.1  --   --  94.2  --   PLT 281  --   --  105*  --      Basic Metabolic Panel: Recent Labs  Lab 09/24/2022 0237 09/21/2022 0323 09/26/2022 0405 09/15/2022 0623  NA 138 138 138 138  K 5.2* 4.7 4.5 3.9  CL 107  --   --   --   CO2 18*  --   --   --   GLUCOSE 229*  --   --    --   BUN 18  --   --   --   CREATININE 1.19  --   --   --   CALCIUM 8.4*  --   --   --     GFR: Estimated Creatinine Clearance: 73.5 mL/min (by C-G formula based on SCr of 1.19 mg/dL). Recent Labs  Lab 09/20/2022 0232 08/31/2022 0237 09/20/2022 0526  WBC  --  14.1* 15.3*  LATICACIDVEN 7.0*  --  1.8     Liver Function Tests: Recent Labs  Lab 09/07/2022 0237  AST 148*  ALT 121*  ALKPHOS 59  BILITOT 0.3  PROT 6.3*  ALBUMIN 3.6   No results for input(s): "LIPASE", "AMYLASE" in the last 168 hours. No results for input(s): "AMMONIA" in the last 168 hours.  ABG    Component Value Date/Time   PHART 7.329 (L) 09/19/2022 0623   PCO2ART 43.6 09/27/2022 0623   PO2ART 105 09/17/2022 0623   HCO3 22.9 09/22/2022 0623   TCO2 24 09/03/2022 0623   ACIDBASEDEF 3.0 (H) 09/02/2022 2993  O2SAT 98 09/08/2022 0623      This patient is critically ill with multiple organ system failure which requires frequent high complexity decision making, assessment, support, evaluation, and titration of therapies. This was completed through the application of advanced monitoring technologies and extensive interpretation of multiple databases. During this encounter critical care time was devoted to patient care services described in this note for 45 minutes.   Steffanie Dunn, DO 09/08/2022 9:36 AM [facility]

## 2022-09-09 NOTE — ED Provider Notes (Addendum)
[facility] EMERGENCY DEPARTMENT Provider Note   CSN: [id number] Arrival date & time: 09/02/2022  0222     History  Chief Complaint  Patient presents with   Cardiac Arrest    Darren Lane is a 61 y.o. male.  The history is provided by the EMS personnel and the police. The history is limited by the condition of the patient (arrest).  Cardiac Arrest Witnessed by:  Not witnessed Incident location:  Home Time since incident: unknown. Time before BLS initiated:  > 5 minutes Time before ALS initiated:  > 10 minutes Condition upon EMS arrival:  Unresponsive and apneic Pulse:  Absent Initial cardiac rhythm per EMS:  Asystole Treatments prior to arrival:  ACLS protocol and vascular access Medications given prior to ED:  Epinephrine (epi x 2) IV access type:  Peripheral Airway: king airway. Rhythm on admission to ED:  Sinus tachycardia Associated symptoms: no vomiting   Risk factors: drug overdose   Risk factors comment:  Using fentanyl reportedly Patient with a history of retinal detachment and cerebrovascular disease presents in cardiac arrest per reports patient was using fentanyl at home and then was found by girlfriend in arrest after an unknown down time.  Initial rhythm was asystole and CPR and 2 epinephrines given.    Past Medical History:  Diagnosis Date   Asthma         Home Medications Prior to Admission medications   Medication Sig Start Date End Date Taking? Authorizing Provider  aspirin EC 81 MG tablet Take 81 mg by mouth daily.    [provider]      Allergies    Patient has no known allergies.    Review of Systems   Review of Systems  Unable to perform ROS: Acuity of condition (cardiac/respiratory arrest)  Respiratory:  Positive for wheezing.   Gastrointestinal:  Negative for vomiting.  Skin:  Negative for wound.    Physical Exam Updated Vital Signs BP (!) 189/108   Pulse (!) 107   Resp (!) 21   Ht 5\' 9"  (1.753 m)    SpO2 100%   BMI 23.63 kg/m  Physical Exam Vitals and nursing note reviewed. Exam conducted with a chaperone present.  Constitutional:      Appearance: He is well-developed. He is diaphoretic.  HENT:     Head: Normocephalic and atraumatic.     Right Ear: Tympanic membrane normal.     Left Ear: Tympanic membrane normal.     Nose: Nose normal.     Mouth/Throat:     Mouth: Mucous membranes are moist.  Eyes:     Conjunctiva/sclera: Conjunctivae normal.  Cardiovascular:     Rate and Rhythm: Regular rhythm. Tachycardia present.  Pulmonary:     Breath sounds: Decreased air movement present. Wheezing present. No rales.  Abdominal:     Palpations: Abdomen is soft. There is no mass.  Musculoskeletal:        General: Normal range of motion.     Cervical back: Normal range of motion and neck supple.     Right lower leg: No edema.     Left lower leg: No edema.  Skin:    General: Skin is warm.     Capillary Refill: Capillary refill takes 2 to 3 seconds.  Neurological:     GCS: GCS eye subscore is 1. GCS verbal subscore is 1. GCS motor subscore is 1.     ED Results / Procedures / Treatments   Labs (all labs  ordered are listed, but only abnormal results are displayed) Results for orders placed or performed during the hospital encounter of 09/15/2022  Resp Panel by RT-PCR (Flu A&B, Covid) Anterior Nasal Swab   Specimen: Anterior Nasal Swab  Result Value Ref Range   SARS Coronavirus 2 by RT PCR NEGATIVE NEGATIVE   Influenza A by PCR NEGATIVE NEGATIVE   Influenza B by PCR NEGATIVE NEGATIVE  Comprehensive metabolic panel  Result Value Ref Range   Sodium 138 135 - 145 mmol/L   Potassium 5.2 (H) 3.5 - 5.1 mmol/L   Chloride 107 98 - 111 mmol/L   CO2 18 (L) 22 - 32 mmol/L   Glucose, Bld 229 (H) 70 - 99 mg/dL   BUN 18 6 - 20 mg/dL   Creatinine, Ser 4.08 0.61 - 1.24 mg/dL   Calcium 8.4 (L) 8.9 - 10.3 mg/dL   Total Protein 6.3 (L) 6.5 - 8.1 g/dL   Albumin 3.6 3.5 - 5.0 g/dL   AST 144 (H)  15 - 41 U/L   ALT 121 (H) 0 - 44 U/L   Alkaline Phosphatase 59 38 - 126 U/L   Total Bilirubin 0.3 0.3 - 1.2 mg/dL   GFR, Estimated >81 >85 mL/min   Anion gap 13 5 - 15  Salicylate level  Result Value Ref Range   Salicylate Lvl <7.0 (L) 7.0 - 30.0 mg/dL  Acetaminophen level  Result Value Ref Range   Acetaminophen (Tylenol), Serum <10 (L) 10 - 30 ug/mL  Ethanol  Result Value Ref Range   Alcohol, Ethyl (B) <10 <10 mg/dL  Urine rapid drug screen (hosp performed)  Result Value Ref Range   Opiates POSITIVE (A) NONE DETECTED   Cocaine POSITIVE (A) NONE DETECTED   Benzodiazepines NONE DETECTED NONE DETECTED   Amphetamines NONE DETECTED NONE DETECTED   Tetrahydrocannabinol NONE DETECTED NONE DETECTED   Barbiturates NONE DETECTED NONE DETECTED  CBC WITH DIFFERENTIAL  Result Value Ref Range   WBC 14.1 (H) 4.0 - 10.5 K/uL   RBC 4.62 4.22 - 5.81 MIL/uL   Hemoglobin 14.0 13.0 - 17.0 g/dL   HCT 63.1 49.7 - 02.6 %   MCV 96.1 80.0 - 100.0 fL   MCH 30.3 26.0 - 34.0 pg   MCHC 31.5 30.0 - 36.0 g/dL   RDW 37.8 58.8 - 50.2 %   Platelets 281 150 - 400 K/uL   nRBC 0.0 0.0 - 0.2 %   Neutrophils Relative % 51 %   Neutro Abs 7.1 1.7 - 7.7 K/uL   Lymphocytes Relative 29 %   Lymphs Abs 4.1 (H) 0.7 - 4.0 K/uL   Monocytes Relative 8 %   Monocytes Absolute 1.1 (H) 0.1 - 1.0 K/uL   Eosinophils Relative 6 %   Eosinophils Absolute 0.9 (H) 0.0 - 0.5 K/uL   Basophils Relative 1 %   Basophils Absolute 0.1 0.0 - 0.1 K/uL   Immature Granulocytes 5 %   Abs Immature Granulocytes 0.71 (H) 0.00 - 0.07 K/uL  Lactic acid, plasma  Result Value Ref Range   Lactic Acid, Venous 7.0 (HH) 0.5 - 1.9 mmol/L  CBG monitoring, ED  Result Value Ref Range   Glucose-Capillary 181 (H) 70 - 99 mg/dL  I-Stat arterial blood gas, ED  Result Value Ref Range   pH, Arterial 7.105 (LL) 7.35 - 7.45   pCO2 arterial 81.6 (HH) 32 - 48 mmHg   pO2, Arterial 510 (H) 83 - 108 mmHg   Bicarbonate 26.1 20.0 - 28.0 mmol/L   TCO2 29  22 -  32 mmol/L   O2 Saturation 100 %   Acid-base deficit 6.0 (H) 0.0 - 2.0 mmol/L   Sodium 138 135 - 145 mmol/L   Potassium 4.7 3.5 - 5.1 mmol/L   Calcium, Ion 1.22 1.15 - 1.40 mmol/L   HCT 47.0 39.0 - 52.0 %   Hemoglobin 16.0 13.0 - 17.0 g/dL   Patient temperature 81.8 F    Collection site RADIAL, ALLEN'S TEST ACCEPTABLE    Drawn by RT    Sample type ARTERIAL   I-Stat arterial blood gas, ED  Result Value Ref Range   pH, Arterial 7.201 (L) 7.35 - 7.45   pCO2 arterial 62.8 (H) 32 - 48 mmHg   pO2, Arterial 81 (L) 83 - 108 mmHg   Bicarbonate 25.1 20.0 - 28.0 mmol/L   TCO2 27 22 - 32 mmol/L   O2 Saturation 94 %   Acid-base deficit 5.0 (H) 0.0 - 2.0 mmol/L   Sodium 138 135 - 145 mmol/L   Potassium 4.5 3.5 - 5.1 mmol/L   Calcium, Ion 1.22 1.15 - 1.40 mmol/L   HCT 46.0 39.0 - 52.0 %   Hemoglobin 15.6 13.0 - 17.0 g/dL   Patient temperature 29.9 F    Collection site RADIAL, ALLEN'S TEST ACCEPTABLE    Drawn by RT    Sample type ARTERIAL   Troponin I (High Sensitivity)  Result Value Ref Range   Troponin I (High Sensitivity) 31 (H) <18 ng/L   CT Head Wo Contrast  Result Date: 09/15/2022 CLINICAL DATA:  Found down at home after using fentanyl EXAM: CT HEAD WITHOUT CONTRAST TECHNIQUE: Contiguous axial images were obtained from the base of the skull through the vertex without intravenous contrast. RADIATION DOSE REDUCTION: This exam was performed according to the departmental dose-optimization program which includes automated exposure control, adjustment of the mA and/or kV according to patient size and/or use of iterative reconstruction technique. COMPARISON:  01/07/2017 CT head FINDINGS: Brain: No evidence of acute infarction, hemorrhage, mass, mass effect, or midline shift. No hydrocephalus or extra-axial fluid collection. Gray-white differentiation is preserved. Vascular: No hyperdense vessel. Skull: Normal. Negative for fracture or focal lesion. Sinuses/Orbits: Mucosal thickening and bubbly  fluid throughout the paranasal sinuses, which may be related to intubation. The orbits are unremarkable. Other: The mastoid air cells are well aerated. IMPRESSION: No acute intracranial process. Electronically Signed   By: Wiliam Ke M.D.   On: 09/21/2022 03:14   DG Chest Portable 1 View  Result Date: 09/15/2022 CLINICAL DATA:  Cardiac arrest status post intubation EXAM: PORTABLE CHEST 1 VIEW COMPARISON:  Radiographs 01/07/2017 FINDINGS: Endotracheal tube tip in the mid intrathoracic trachea. Subdiaphragmatic enteric tube. Defibrillator pad. The lungs are clear. No pleural effusion or pneumothorax. Normal cardiomediastinal silhouette. No displaced rib fractures. IMPRESSION: 1. ETT in good position. 2. The lungs are clear. Electronically Signed   By: Minerva Fester M.D.   On: 09/12/2022 03:05   DG Abdomen 1 View  Result Date: 09/26/2022 CLINICAL DATA:  OG placement EXAM: ABDOMEN - 1 VIEW COMPARISON:  None Available. FINDINGS: Enteric tube tip and side port in the stomach. The bowel gas pattern is unremarkable. No radio-opaque calculi or other significant radiographic abnormality are seen. IMPRESSION: Enteric tube tip and side-port in the stomach. Electronically Signed   By: Minerva Fester M.D.   On: 09/26/2022 03:03      EKG  EKG Interpretation  Date/Time:  Sunday September 09 2022 02:30:10 EDT Ventricular Rate:  114 PR Interval:  175 QRS  Duration: 82 QT Interval:  326 QTC Calculation: 449 R Axis:   69 Text Interpretation: Sinus tachycardia Probable left atrial enlargement Minimal ST depression, anterior leads Confirmed by Nicanor Alcon, Finleigh Cheong ([id number]) on 10/04/2022 2:50:24 AM         Radiology No results found.  Procedures Procedures    Medications Ordered in ED Medications  etomidate (AMIDATE) injection 20 mg (has no administration in time range)  rocuronium bromide 10 mg/mL (PF) syringe (has no administration in time range)  fentaNYL in NS (72mcg/ml)  infusion-PREMIX (has no administration in time range)  albuterol (PROVENTIL) (2.5 MG/3ML) 0.083% nebulizer solution 5 mg (5 mg Nebulization Given October 04, 2022 0240)  etomidate (AMIDATE) injection (20 mg Intravenous Given 2022-10-04 0225)  rocuronium (ZEMURON) injection (80 mg Intravenous Given October 04, 2022 0226)    ED Course/ Medical Decision Making/ A&P                           Medical Decision Making Reportedly using fentanyl and was found by girlfriend in cardiac arrest after unknown down time   Amount and/or Complexity of Data Reviewed Independent Historian: EMS    Details: See above  Labs: ordered.    Details: All labs reviewed: blood cultures sent and pending, slight elevation of CBG 181.  Elevated white count 14.1 normal hemoglobin 15.6 normal platelet count.  2 abgs that are acidotic with hypercapnia. Vent adjustments made.  Negative Tylenol and salicylate levels.  Opioids and cocaine in UDS.  Troponin elevated 31.  Normal sodium and potassium 4.5 and normal creatinine 1.19,  elevated glucose 229.  Elevated AST 148 and ALT 121.  Lactate markedly high 7 Radiology: ordered and independent interpretation performed.    Details: ETT in good position on CXR.  Negative CT by me  ECG/medicine tests: ordered and independent interpretation performed. Decision-making details documented in ED Course. Discussion of management or test interpretation with external provider(s): 301 case d/w Dr. Welton Flakes of cardiology, EKG here is not ischemic.  Given first rhythm was asystole, no intervention at this time.  Echo in am.   Case d/w PCCM attending, Dr. Delton Coombes who will admit the patient .   Risk Prescription drug management. Parenteral controlled substances. Decision regarding hospitalization. Risk Details: No movement post intubation.   Critical Care Total time providing critical care: 75 minutes (Repeat assessments, high complexity of diagnostic modalities, decision making, treatments.  Ventilator management.   Consults )    Final Clinical Impression(s) / ED Diagnoses Final diagnoses:  Cardiac arrest (HCC)  Overdose of undetermined intent, initial encounter   The patient appears reasonably stabilized for admission considering the current resources, flow, and capabilities available in the ED at this time, and I doubt any other [facility] requiring further screening and/or treatment in the ED prior to admission.      Blessen Kimbrough, MD 2022-10-04 1275

## 2022-09-09 NOTE — Progress Notes (Addendum)
EEG reviewed, showed some improvement but then worsening of myoclonic seizures Discussed with nursing, propofol had been reduced due to blood pressure drop after initiation of versed, but BP has now stabilized  Increased propofol to 60 Continue versed 10  Continue keppra  S/p Keppra 4500 mg load @ 1 PM, continue 1500 mg BID. Will adjust as needed for renal function:  Estimated Creatinine Clearance: 88.3 mL/min (by C-G formula based on SCr of 0.99 mg/dL).   CrCl 80 to 130 mL/minute/1.73 m2: 500 mg to 1.5 g every 12 hours.  CrCl 50 to <80 mL/minute/1.73 m2: 500 mg to 1 g every 12 hours.  CrCl 30 to <50 mL/minute/1.73 m2: 250 to 750 mg every 12 hours.  CrCl 15 to <30 mL/minute/1.73 m2: 250 to 500 mg every 12 hours.  CrCl <15 mL/minute/1.73 m2: 250 to 500 mg every 24 hours (expert opinion). Neurology will continue to monitor EEG and adjust propofol/keppra as needed, may avoid boluses to avoid BP instability  Brooke Dare MD-PhD [facility] [phone] Available 7 AM to 7 PM, outside these hours please contact Neurologist on call listed on AMION

## 2022-09-09 NOTE — Progress Notes (Signed)
EEG complete - results pending.  Darren Lane

## 2022-09-09 NOTE — Progress Notes (Signed)
Pt transported to CT and back on ventilator with no complications. RN, RT, and NT at bedside.

## 2022-09-09 NOTE — ED Notes (Signed)
Patient with myoclonus/seizure like activity at this time.  PCCM in exam room.  New orders per PCCM.

## 2022-09-10 DIAGNOSIS — J9601 Acute respiratory failure with hypoxia: Secondary | ICD-10-CM

## 2022-09-10 DIAGNOSIS — T50904A Poisoning by unspecified drugs, medicaments and biological substances, undetermined, initial encounter: Secondary | ICD-10-CM

## 2022-09-10 DIAGNOSIS — G931 Anoxic brain damage, not elsewhere classified: Secondary | ICD-10-CM

## 2022-09-10 LAB — GLUCOSE, CAPILLARY
Glucose-Capillary: 155 mg/dL — ABNORMAL HIGH (ref 70–99)
Glucose-Capillary: 184 mg/dL — ABNORMAL HIGH (ref 70–99)
Glucose-Capillary: 176 mg/dL — ABNORMAL HIGH (ref 70–99)
Glucose-Capillary: 174 mg/dL — ABNORMAL HIGH (ref 70–99)
Glucose-Capillary: 165 mg/dL — ABNORMAL HIGH (ref 70–99)

## 2022-09-10 LAB — CBC
HCT: 39.7 % (ref 39.0–52.0)
Hemoglobin: 13.3 g/dL (ref 13.0–17.0)
MCH: 29.7 pg (ref 26.0–34.0)
MCHC: 33.5 g/dL (ref 30.0–36.0)
MCV: 88.6 fL (ref 80.0–100.0)
Platelets: 213 10*3/uL (ref 150–400)
RBC: 4.48 MIL/uL (ref 4.22–5.81)
RDW: 13.3 % (ref 11.5–15.5)
WBC: 18.6 10*3/uL — ABNORMAL HIGH (ref 4.0–10.5)
nRBC: 0 % (ref 0.0–0.2)

## 2022-09-10 LAB — BASIC METABOLIC PANEL
Anion gap: 12 (ref 5–15)
BUN: 20 mg/dL (ref 6–20)
CO2: 22 mmol/L (ref 22–32)
Calcium: 8.2 mg/dL — ABNORMAL LOW (ref 8.9–10.3)
Chloride: 105 mmol/L (ref 98–111)
Creatinine, Ser: 0.87 mg/dL (ref 0.61–1.24)
GFR, Estimated: >60 mL/min (ref >60)
Glucose, Bld: 175 mg/dL — ABNORMAL HIGH (ref 70–99)
Potassium: 3.9 mmol/L (ref 3.5–5.1)
Sodium: 139 mmol/L (ref 135–145)

## 2022-09-10 LAB — PHOSPHORUS: Phosphorus: 3.5 mg/dL (ref 2.5–4.6)

## 2022-09-10 LAB — HIV ANTIBODY (ROUTINE TESTING W REFLEX): HIV Screen 4th Generation wRfx: NONREACTIVE

## 2022-09-10 LAB — TRIGLYCERIDES: Triglycerides: 244 mg/dL — ABNORMAL HIGH (ref <150)

## 2022-09-10 LAB — LACTIC ACID, PLASMA: Lactic Acid, Venous: 1.5 mmol/L (ref 0.5–1.9)

## 2022-09-10 MED ORDER — ALBUTEROL SULFATE (2.5 MG/3ML) 0.083% IN NEBU
INHALATION_SOLUTION | RESPIRATORY_TRACT | Status: AC
  Filled 2022-09-10: qty 3

## 2022-09-10 MED ORDER — LABETALOL HCL 5 MG/ML IV SOLN
10.0000 mg | Freq: Four times a day (QID) | INTRAVENOUS | Status: DC | PRN
  Administered 2022-09-11 – 2022-09-12 (×3): 10 mg via INTRAVENOUS
  Administered 2022-09-12: 20 mg via INTRAVENOUS
  Filled 2022-09-10 (×3): qty 4

## 2022-09-10 MED ORDER — IPRATROPIUM-ALBUTEROL 0.5-2.5 (3) MG/3ML IN SOLN
3.0000 mL | Freq: Three times a day (TID) | RESPIRATORY_TRACT | Status: DC
  Administered 2022-09-11 (×2): 3 mL via RESPIRATORY_TRACT
  Filled 2022-09-10 (×2): qty 3

## 2022-09-10 MED ORDER — ALBUTEROL SULFATE (2.5 MG/3ML) 0.083% IN NEBU
2.5000 mg | INHALATION_SOLUTION | RESPIRATORY_TRACT | Status: DC | PRN
  Administered 2022-09-10: 2.5 mg via RESPIRATORY_TRACT

## 2022-09-10 NOTE — Progress Notes (Addendum)
NAME:  Darren Lane, MRN:  [id number], DOB:  [birth year], LOS: 1 ADMISSION DATE:  09/17/2022, CONSULTATION DATE:  9/10 REFERRING MD:  Dr. Nicanor Alcon, CHIEF COMPLAINT:  post arrest   History of Present Illness:  Patient is a 61 year old male with pertinent PMH of asthma and polysubstance abuse presents to [facility] ED on 9/10 post arrest.  On 9/10, patient was reportedly found down by a girlfriend who states patient was found patient down unresponsive and pulseless. Reports that he was using fentanyl earlier. Unwitnessed arrest. EMS called and found patient pulseless and asystole. Powder appreciated on floor on scene.  CPR started and [facility] airway placed.  Epi given.  ROSC about 20 minutes.  Patient transferred to [facility] ED.  Upon arrival to [facility] ED on 9/10 patient with [facility] airway.  UDS was obtained prior to giving RSI medications for ETT exchange.  Patient initial BP 189/108, HR 107.  Breathing spontaneously but diminished wheezing present.  GCS 1.  CXR appears clear with ETT in good position.  CT head with no acute abnormality.  EKG with no elevated ST.  LA 7, K5.2, glucose 229, AST 148, ALT 121.  Salicylate, acetaminophen, ethanol WNL.  UDS positive for cocaine and opiates.  WBC 14.1.  Troponin 31.  ABG 7.10, 81, 510, 26.  PCCM consulted for ICU admission.  Pertinent  Medical History  polysubstance abuse Asthma Retinal detachment of right eye  Significant Hospital Events: Including procedures, antibiotic start and stop dates in addition to other pertinent events   9/10: post arrest; unknown down time; found with fentanyl and cocaine in UDS; EMS called found pulseless in asystole; king airway placed and performed CPR for about 20 minutes until rosc. Admitted to [facility].  Interim History / Subjective:  No further myoclonus. On high dose sedation  Objective   Blood pressure 91/61, pulse 90, temperature 98.4 F (36.9 C), temperature source Esophageal, resp. rate (!) 30, height 5\' 9"  (1.753 m), weight 87.5 kg,  SpO2 92 %.    Vent Mode: PRVC FiO2 (%):  [40 %-50 %] 50 % Set Rate:  [30 bmp] 30 bmp Vt Set:  [560 mL] 560 mL PEEP:  [5 cmH20] 5 cmH20 Plateau Pressure:  [12 cmH20-18 cmH20] 18 cmH20   Intake/Output Summary (Last 24 hours) at 09/10/2022 0825 Last data filed at 09/10/2022 0800 Gross per 24 hour  Intake 1465.02 ml  Output 1385 ml  Net 80.02 ml    Filed Weights   09/12/2022 0245 09/07/2022 0654 09/10/22 0500  Weight: 73 kg 88.3 kg 87.5 kg    Examination: General: Adult male, critically ill, resting in bed, in NAD. Neuro: Sedated, not responsive. No cough, gag. Flexor withdrawal to pain in uppers and lowers. HEENT: [place]/AT. Sclerae anicteric. ETT in place. Cardiovascular: RRR, no M/R/G.  Lungs: Respirations even and unlabored.  CTA bilaterally, No W/R/R. Abdomen: BS x 4, soft, NT/ND.  Musculoskeletal: No gross deformities, no edema.  Skin: Intact, warm, no rashes.  Assessment & Plan:   Cardiac Arrest: Unwitnessed arrest; found unresponsive, apneic, pulses were absent, found to be in asystole; CPR started and [facility] airway placed, given epi.  UDS positive for opiates and cocaine (obtained prior to starting fentanyl). Roughly 20 minutes ACLS after EMS arrival. -CT head with no acute abnormality; UDS positive for cocaine/opiates P: -con't supportive care -tele monitoring -con't TTM for normothermia -Start to slowly wean sedation given no further myoclonus. Will start with Midazolam and wean by 1 per hour then move to Propofol by 5 per  hour -Have asked family to discuss goals of care further, spoke with sister on phone and wife at bedside. They will meet today and let us know of their decision. They are leaning towards DNR.  Acute respiratory failure with hypercarbia and hypoxia on MV Asthma exacerbation P: -LTVV -VAP prevention protocol -PAD protocol for sedation -daily SAT & SBT as appropriate; not stable for this yet due to high sedation and mental status -wean vent as able to  maintain SpO2 -duonebs Q4h -con't high dose steroids for now and wean 9/12  Likely strep bacteremia vs contaminant -empiric ceftriaxone -con't to follow cultures -repeat blood cultures today  Acute encephalopathy with concern for significant anoxic injury Polysubstance abuse Myoclonic seizures -Postarrest unknown downtime; likely hypercarbia related from drug overdose suppressing respirations; UDS positive for cocaine/opiates -CT head with no acute abnormality; ethanol, salicylate, acetaminophen level WNL P: -neuro following- appreciate their recommendations -Continue Keppra, Versed, Propofol but start to wean Versed and Propofol as neuro status allows -EEG ongoing per neuro -lactulose for hyperammonemia  Lactic acidosis, likely worsened again due to seizures -recheck today -seizure suppression  Hyperglycemia, prediabetes (A1c 6.4) P: -SSI PRN -goal BG 140-180  Transaminase elevation likely 2/2 shock liver hyperammonemia P: -ammonia -avoid hepatotoxic meds  Thrombocytopenia likely 2/2 acute illness -monitor -no current indication for transfusion  History of CVA -aspirin, hold statin until liver function improves  See separate ipal note from Dr. Kemper Durie 9/10.  Best Practice (right click and "Reselect all SmartList Selections" daily)   Diet/type: NPO w/ meds via tube DVT prophylaxis: prophylactic heparin  GI prophylaxis: PPI Lines: N/A Foley:  Yes, and it is still needed Code Status:  full code Last date of multidisciplinary goals of care discussion [9/10- con't full code, discussing code status].  Discussed with wife at bedside and sister over the phone 9/11. Sister waiting for additional family to come in to town today before coming to the hospital for further discussions regarding goals of care. They are leaning towards DNR.   CC time: 35 min.   [name], PA - [facility] For pager details, please see AMION or use Epic  chat  After 1900, please call [facility] for cross coverage needs 09/10/2022, 8:31 AM

## 2022-09-10 NOTE — Procedures (Signed)
Patient Name: Darren Lane  MRN: [id number]  Epilepsy Attending: Charlsie Quest  Referring Physician/Provider: Steffanie Dunn, DO  Duration: 09/03/2022 0825 to 09/10/2022 0825   Patient history: 61yo M s/p cardiac arrest. EEG to evaluate for seizure   Level of alertness: comatose   AEDs during EEG study: propofol, versed, LEV   Technical aspects: This EEG study was done with scalp electrodes positioned according to the 10-20 International system of electrode placement. Electrical activity was reviewed with band pass filter of 1-70Hz , sensitivity of 7 uV/mm, display speed of 47mm/sec with a 60Hz  notched filter applied as appropriate. EEG data were recorded continuously and digitally stored.  Video monitoring was available and reviewed as appropriate.   Description: Patient was noted to have episodes of sudden eye opening with whole body jerking every 10-20 seconds lasting 5-15 seconds. Concomitant EEG showed generalized polyspikes consistent with myoclonic seizures.  In between seizures EEG showed generalized background suppression.  As sedation was adjusted, clinical jerking/seizures stopped.  EEG then showed burst suppression pattern with highly epileptiform bursts lasting 10-15 seconds alternating with EEG suppression lasting 5 to 15 seconds.  After around 1800, the duration of bursts became smaller and rare 1 second bursts were noted with near continuous background suppression which was not reactive to stimulation.  Gradually after around 2 AM, EEG showed burst suppression with bursts of polymorphic 3 to 5 Hz  theta-delta slowing lasting 1 to 2 seconds alternating with 3 to 5 seconds of generalized EEG suppression hyperventilation and photic stimulation were not performed.      ABNORMALITY - Myoclonic seizures, generalized - Burst suppression, generalized   IMPRESSION: At the beginning of the study, patient was noted to have myoclonic seizures every  10-20 seconds lasting 5-15 seconds as  described above.  As sedation was adjusted, myoclonic seizures abated.  EEG was then suggestive of epileptogenicity with generalized onset and high risk of seizure recurrence as well as profound diffuse encephalopathy.  In the setting of cardiac arrest, this EEG pattern is suggestive of anoxic/hypoxic brain injury.   [name] 

## 2022-09-10 NOTE — IPAL (Signed)
  Interdisciplinary Goals of Care Family Meeting   Date carried out: 09/10/2022  Location of the meeting: Conference room  Member's involved: Family Member or next of kin  Durable Power of Attorney or Environmental health practitioner: Pt's wife, Darren Lane.    Discussion: We discussed goals of care for Darren Lane. We discussed his current condition and circumstances. I explained his poor mental exam; however, also explained that this is technically blunted by him being on Propofol at 50 and Fentanyl at 50. He was on Versed at 10 this morning as well; however, we have been able to wean this to off and he has had no further myoclonus or seizures. I explained that the goal is to continue weaning sedation down as low as possible so that we can better assess his mental status and that we will also obtain an MRI to assist with prognostication. Family has requested to continue current scope of treatment including leaving code status as FULL CODE for the time being. They are willing to revisit and rediscuss should there be no change or worsening in his condition by Wednesday 09/12/22.  Code status: Full Code  Disposition: Continue current acute care  Time spent for the meeting: 25 min.    [name], PA-C  09/10/2022, 3:46 PM

## 2022-09-10 NOTE — Progress Notes (Signed)
Initial Nutrition Assessment  DOCUMENTATION CODES:   Not applicable  INTERVENTION:  Pending GOC discussions, initiate tube feeding via OGT: Vital AF 1.2 at 55 ml/h (1320 ml per day) Prosource TF20 60 ml 1x/d (20g protein and 80kcal) Provides 1664 kcal (TF+propofol = 2504), 119 gm protein, 1071 ml free water daily  NUTRITION DIAGNOSIS:   Inadequate oral intake related to inability to eat as evidenced by NPO status.  GOAL:   Patient will meet greater than or equal to 90% of their needs  MONITOR:   Labs, Vent status, I & O's  REASON FOR ASSESSMENT:   Ventilator    ASSESSMENT:   Pt with hx of substance abuse (cocaine and opiates) brought to ED after being found at home, apneic and pulseless by girlfriend after using fentanyl. Unknown downtime.   Noted that ROSC obtained after 6 rounds of CPR, 2 epi pushes and arrives to ED on epi drip, ~20 minutes.   Patient is currently intubated on ventilator support OGT in place for medications. Discussed nutrition hx with wife at bedside. Reports that pt does not have a great appetite at baseline. Typically just has coffee for breakfast, sometimes will eat pizza for lunch, and then will have dinner after he returns home from work. No major changes in weight.  Discussed initiation of TF with provider. Family meeting is scheduled for this afternoon with additional family members and medical team to discuss prognosis and extent of brain damage. Will hold off on initiation of nutrition until GOC discussion is complete.   Pt is requiring high dose propofol for myoclonic seizures which is providing a significant amount of kcal.   MV: 16.9 L/min Temp (24hrs), Avg:98.5 F (36.9 C), Min:97.7 F (36.5 C), Max:98.8 F (37.1 C)  Propofol: 31.8 ml/hr (840 kcal/d)   Intake/Output Summary (Last 24 hours) at 09/10/2022 1437 Last data filed at 09/10/2022 1300 Gross per 24 hour  Intake 1427.63 ml  Output 295 ml  Net 1132.63 ml  Net IO Since  Admission: -334.92 mL [09/10/22 1437]  Nutritionally Relevant Medications: Scheduled Meds:  docusate  100 mg Per Tube BID   insulin aspart  0-6 Units Subcutaneous Q4H   lactulose  10 g Per Tube TID   methylPREDNISolone (SOLU-MEDROL) injection  125 mg Intravenous Q12H   pantoprazole  40 mg Per Tube Daily   polyethylene glycol  17 g Per Tube Daily   Continuous Infusions:  fentaNYL infusion INTRAVENOUS 50 mcg/hr (09/10/22 1000)   midazolam 5 mg/hr (09/10/22 1000)   propofol (DIPRIVAN) infusion 60 mcg/kg/min (09/10/22 1000)   PRN Meds: docusate sodium, polyethylene glycol, propofol  Labs Reviewed: CBG ranges from 152-288 mg/dL over the last 24 hours HgbA1c 6.4% Triglycerides 244  Lab Results  Component Value Date   ALT 121 (H) 09/12/2022   AST 148 (H) 09/07/2022   ALKPHOS 59 09/25/2022   BILITOT 0.3 09/25/2022   NUTRITION - FOCUSED PHYSICAL EXAM:  Flowsheet Row Most Recent Value  Orbital Region No depletion  Upper Arm Region No depletion  Thoracic and Lumbar Region No depletion  Buccal Region No depletion  Temple Region Mild depletion  Clavicle Bone Region Mild depletion  Clavicle and Acromion Bone Region No depletion  Scapular Bone Region No depletion  Dorsal Hand No depletion  Patellar Region No depletion  Anterior Thigh Region No depletion  Posterior Calf Region No depletion  Edema (RD Assessment) None  Hair Reviewed  Eyes Unable to assess  Mouth Unable to assess  Skin Reviewed  Nails Reviewed  Diet Order:   Diet Order             Diet NPO time specified Except for: Other (See Comments)  Diet effective now                   EDUCATION NEEDS:   Not appropriate for education at this time  Skin:  Skin Assessment: Reviewed RN Assessment  Last BM:  unsure  Height:   Ht Readings from Last 1 Encounters:  27-Sep-2022 5\' 9"  (1.753 m)    Weight:   Wt Readings from Last 1 Encounters:  09/10/22 87.5 kg    Ideal Body Weight:  72.7 kg  BMI:  Body  mass index is 28.49 kg/m.  Estimated Nutritional Needs:   Kcal:  2300-2500 kcal/d  Protein:  115-130g/d  Fluid:  2.3-2.5 L/d    11/10/22, RD, LDN Clinical Dietitian RD pager # available in [facility]  After hours/weekend pager # available in [facility]

## 2022-09-10 NOTE — Progress Notes (Signed)
eLink Physician-Brief Progress Note Patient Name: Darren Lane DOB: 09-Sep-1961 MRN: [id number]   Date of Service  09/10/2022  HPI/Events of Note  Notified of hypertension. BP 167/97, HR 101.   BP trending up as sedation is being weaned.   eICU Interventions  Labetalol IV PRN ordered.     Intervention Category Intermediate Interventions: Hypertension - evaluation and management  Larinda Buttery 09/10/2022, 11:52 PM

## 2022-09-10 NOTE — Progress Notes (Signed)
Neurology Progress Note  Patient ID: Darren Lane is a 61 y.o. with PMHx of  has a past medical history of Asthma.  Interval/Subjective: -Ongoing goals of care discussion with family by CCM team -Remains unresponsive   Exam: Vitals:   09/10/22 0700 09/10/22 0800  BP: 100/63 91/61  Pulse: 93 90  Resp: (!) 30 (!) 30  Temp: 97.7 F (36.5 C) 98.4 F (36.9 C)  SpO2: 91% 92%   Physical Exam  Constitutional: Appears ill Psych: Not interactive Eyes: Scleral edema is mild HENT: ET tube in place MSK: no joint deformities.  Cardiovascular: Normal rate and regular rhythm.  Respiratory: Breathing comfortably on the ventilator,  GI: Soft.  No distension. There is no tenderness.  Skin: Warm dry and intact visible skin.    Neuro: Performed on 60 of propofol, 10 of Versed, as well as fentanyl drip  Mental Status: Does not open eyes spontaneously, to voice or noxious stimulation Does not follow any commands Cranial Nerves: II: No blink to threat. Pupils are equal, round, and reactive to light, 2 mm to 1 mm III,IV, VI/VIII: EOMI to VOR absent V/VII: Right corneal is present, left is absent VIII: No response to voice X/XI: Intact cough/gag XII: Unable to assess tongue protrusion secondary to patient's mental status  Motor/Sensory: Tone is normal. Bulk is normal.  Flexion posturing in bilateral upper extremities, right brisker than the left.  Triple flexion in the bilateral lower extremities, right more than left   Pertinent Labs:  Basic Metabolic Panel: Recent Labs  Lab 09/16/2022 0237 08/31/2022 0323 08/31/2022 0405 09/02/2022 0526 09/15/2022 0623 08/31/2022 1001 09/15/2022 1708  NA 138 138 138  --  138 140 141  K 5.2* 4.7 4.5  --  3.9 4.0 3.7  CL 107  --   --   --   --  107  --   CO2 18*  --   --   --   --  22  --   GLUCOSE 229*  --   --   --   --  172*  --   BUN 18  --   --   --   --  21*  --   CREATININE 1.19  --   --  0.72  --  0.99  --   CALCIUM 8.4*  --   --   --   --  8.4*   --   MG  --   --   --   --   --  2.2  --    Lab Results  Component Value Date   ALT 121 (H) 09/24/2022   AST 148 (H) 09/21/2022   ALKPHOS 59 09/01/2022   BILITOT 0.3 09/04/2022    Ammonia 9/10 -- 67  CBC: Recent Labs  Lab 09/18/2022 0237 08/31/2022 0323 08/31/2022 0405 09/02/2022 0526 09/16/2022 0623 09/04/2022 1708  WBC 14.1*  --   --  15.3*  --   --   NEUTROABS 7.1  --   --   --   --   --   HGB 14.0 16.0 15.6 13.2 15.6 13.9  HCT 44.4 47.0 46.0 40.9 46.0 41.0  MCV 96.1  --   --  94.2  --   --   PLT 281  --   --  105*  --   --     Coagulation Studies: No results for input(s): "LABPROT", "INR" in the last 72 hours.   EEG personally reviewed, appreciate Dr. Candi Leash formal read: Patient was  noted to have episodes of sudden eye opening with whole body jerking every 10-20 seconds lasting 5-15 seconds. Concomitant EEG showed generalized polyspikes consistent with myoclonic seizures.  In between seizures EEG showed generalized background suppression.  As sedation was adjusted, clinical jerking/seizures stopped.  EEG then showed burst suppression pattern with highly epileptiform bursts lasting 10-15 seconds alternating with EEG suppression lasting 5 to 15 seconds.  After around 1800, the duration of bursts became smaller and rare 1 second bursts were noted with near continuous background suppression which was not reactive to stimulation.  Gradually after around 2 AM, EEG showed burst suppression with bursts of polymorphic 3 to 5 Hz  theta-delta slowing lasting 1 to 2 seconds alternating with 3 to 5 seconds of generalized EEG suppression hyperventilation and photic stimulation were not performed.     Assessment: 61 year old polysubstance abuse patient presented with cardiac arrest with unknown downtime requiring 20 minutes of CPR prior to ROSC, with EEG concerning for myoclonic status, now suppressed.  Will wean sedation today and monitor EEG  Impression: -Postanoxic injury status myoclonus -Likely  substance related cardiac arrest secondary to cocaine/opiates -On targeted temperature management for normothermia and supportive care -Ongoing goals of care discussion -Asthma exacerbation on high-dose steroids -Concern for strep bacteremia versus contaminant on empiric ceftriaxone -Transaminitis with concern for shock liver with hyperammonemia (will limit use of Depakote) -Thrombocytopenia secondary to acute illness  Recommendations: - s/p Keppra 4500 mg load, continue 1500 mg BID. Will adjust as needed for renal function:  Estimated Creatinine Clearance: 88 mL/min (by C-G formula based on SCr of 0.99 mg/dL).   CrCl 80 to 130 mL/minute/1.73 m2: 500 mg to 1.5 g every 12 hours.  CrCl 50 to <80 mL/minute/1.73 m2: 500 mg to 1 g every 12 hours.  CrCl 30 to <50 mL/minute/1.73 m2: 250 to 750 mg every 12 hours.  CrCl 15 to <30 mL/minute/1.73 m2: 250 to 500 mg every 12 hours.  CrCl <15 mL/minute/1.73 m2: 250 to 500 mg every 24 hours (expert opinion). -Propofol currently at 60 -Versed currently at 10, wean Versed by 1 mg every 2 hours today -Neurology will continue to follow, appreciate CCM care of comorbidities  Brooke Dare MD-PhD [facility] [phone]   CRITICAL CARE Performed by: Gordy Councilman   Total critical care time: 35 minutes  Critical care time was exclusive of separately billable procedures and treating other patients.  Critical care was necessary to treat or prevent imminent or life-threatening deterioration.  Critical care was time spent personally by me on the following activities: development of treatment plan with patient and/or surrogate as well as nursing, discussions with consultants, evaluation of patient's response to treatment, examination of patient, obtaining history from patient or surrogate, ordering and performing treatments and interventions, ordering and review of laboratory studies, ordering and review of radiographic studies, pulse  oximetry and re-evaluation of patient's condition.  Discussed with CCM team at bedside

## 2022-09-11 ENCOUNTER — Inpatient Hospital Stay (HOSPITAL_COMMUNITY): Payer: Medicaid Other

## 2022-09-11 DIAGNOSIS — J9601 Acute respiratory failure with hypoxia: Secondary | ICD-10-CM

## 2022-09-11 DIAGNOSIS — G931 Anoxic brain damage, not elsewhere classified: Secondary | ICD-10-CM

## 2022-09-11 DIAGNOSIS — T50904A Poisoning by unspecified drugs, medicaments and biological substances, undetermined, initial encounter: Secondary | ICD-10-CM

## 2022-09-11 DIAGNOSIS — R569 Unspecified convulsions: Secondary | ICD-10-CM

## 2022-09-11 DIAGNOSIS — I469 Cardiac arrest, cause unspecified: Secondary | ICD-10-CM

## 2022-09-11 LAB — COMPREHENSIVE METABOLIC PANEL
ALT: 75 U/L — ABNORMAL HIGH (ref 0–44)
AST: 95 U/L — ABNORMAL HIGH (ref 15–41)
Albumin: 3.2 g/dL — ABNORMAL LOW (ref 3.5–5.0)
Alkaline Phosphatase: 43 U/L (ref 38–126)
Anion gap: 8 (ref 5–15)
BUN: 34 mg/dL — ABNORMAL HIGH (ref 6–20)
CO2: 26 mmol/L (ref 22–32)
Calcium: 8.7 mg/dL — ABNORMAL LOW (ref 8.9–10.3)
Chloride: 111 mmol/L (ref 98–111)
Creatinine, Ser: 1.04 mg/dL (ref 0.61–1.24)
GFR, Estimated: >60 mL/min (ref >60)
Glucose, Bld: 146 mg/dL — ABNORMAL HIGH (ref 70–99)
Potassium: 4.7 mmol/L (ref 3.5–5.1)
Sodium: 145 mmol/L (ref 135–145)
Total Bilirubin: 0.6 mg/dL (ref 0.3–1.2)
Total Protein: 6.2 g/dL — ABNORMAL LOW (ref 6.5–8.1)

## 2022-09-11 LAB — CBC WITH DIFFERENTIAL/PLATELET
Abs Immature Granulocytes: 0.16 10*3/uL — ABNORMAL HIGH (ref 0.00–0.07)
Basophils Absolute: 0 10*3/uL (ref 0.0–0.1)
Basophils Relative: 0 %
Eosinophils Absolute: 0 10*3/uL (ref 0.0–0.5)
Eosinophils Relative: 0 %
HCT: 43.1 % (ref 39.0–52.0)
Hemoglobin: 14.8 g/dL (ref 13.0–17.0)
Immature Granulocytes: 1 %
Lymphocytes Relative: 3 %
Lymphs Abs: 0.6 10*3/uL — ABNORMAL LOW (ref 0.7–4.0)
MCH: 30.3 pg (ref 26.0–34.0)
MCHC: 34.3 g/dL (ref 30.0–36.0)
MCV: 88.1 fL (ref 80.0–100.0)
Monocytes Absolute: 1.8 10*3/uL — ABNORMAL HIGH (ref 0.1–1.0)
Monocytes Relative: 9 %
Neutro Abs: 18.2 10*3/uL — ABNORMAL HIGH (ref 1.7–7.7)
Neutrophils Relative %: 87 %
Platelets: 205 10*3/uL (ref 150–400)
RBC: 4.89 MIL/uL (ref 4.22–5.81)
RDW: 13.5 % (ref 11.5–15.5)
WBC: 20.7 10*3/uL — ABNORMAL HIGH (ref 4.0–10.5)
nRBC: 0 % (ref 0.0–0.2)

## 2022-09-11 LAB — CBC
HCT: 42.1 % (ref 39.0–52.0)
Hemoglobin: 14.7 g/dL (ref 13.0–17.0)
MCH: 30.6 pg (ref 26.0–34.0)
MCHC: 34.9 g/dL (ref 30.0–36.0)
MCV: 87.7 fL (ref 80.0–100.0)
Platelets: 201 10*3/uL (ref 150–400)
RBC: 4.8 MIL/uL (ref 4.22–5.81)
RDW: 13.4 % (ref 11.5–15.5)
WBC: 21.9 10*3/uL — ABNORMAL HIGH (ref 4.0–10.5)
nRBC: 0 % (ref 0.0–0.2)

## 2022-09-11 LAB — BASIC METABOLIC PANEL
Anion gap: 14 (ref 5–15)
BUN: 30 mg/dL — ABNORMAL HIGH (ref 6–20)
CO2: 19 mmol/L — ABNORMAL LOW (ref 22–32)
Calcium: 8.8 mg/dL — ABNORMAL LOW (ref 8.9–10.3)
Chloride: 109 mmol/L (ref 98–111)
Creatinine, Ser: 0.89 mg/dL (ref 0.61–1.24)
GFR, Estimated: >60 mL/min (ref >60)
Glucose, Bld: 163 mg/dL — ABNORMAL HIGH (ref 70–99)
Potassium: 4.6 mmol/L (ref 3.5–5.1)
Sodium: 142 mmol/L (ref 135–145)

## 2022-09-11 LAB — GLUCOSE, CAPILLARY
Glucose-Capillary: 121 mg/dL — ABNORMAL HIGH (ref 70–99)
Glucose-Capillary: 131 mg/dL — ABNORMAL HIGH (ref 70–99)
Glucose-Capillary: 151 mg/dL — ABNORMAL HIGH (ref 70–99)
Glucose-Capillary: 163 mg/dL — ABNORMAL HIGH (ref 70–99)
Glucose-Capillary: 169 mg/dL — ABNORMAL HIGH (ref 70–99)
Glucose-Capillary: 174 mg/dL — ABNORMAL HIGH (ref 70–99)

## 2022-09-11 LAB — POCT I-STAT 7, (LYTES, BLD GAS, ICA,H+H)
Acid-Base Excess: 1 mmol/L (ref 0.0–2.0)
Bicarbonate: 24.7 mmol/L (ref 20.0–28.0)
Calcium, Ion: 1.17 mmol/L (ref 1.15–1.40)
HCT: 40 % (ref 39.0–52.0)
Hemoglobin: 13.6 g/dL (ref 13.0–17.0)
O2 Saturation: 95 %
Patient temperature: 37.3
Potassium: 4.6 mmol/L (ref 3.5–5.1)
Sodium: 144 mmol/L (ref 135–145)
TCO2: 26 mmol/L (ref 22–32)
pCO2 arterial: 37.5 mmHg (ref 32–48)
pH, Arterial: 7.428 (ref 7.35–7.45)
pO2, Arterial: 75 mmHg — ABNORMAL LOW (ref 83–108)

## 2022-09-11 LAB — ABO/RH
ABO/RH(D): A POS
PT AG Type: POSITIVE

## 2022-09-11 LAB — HEPATIC FUNCTION PANEL
ALT: 92 U/L — ABNORMAL HIGH (ref 0–44)
AST: 107 U/L — ABNORMAL HIGH (ref 15–41)
Albumin: 3.4 g/dL — ABNORMAL LOW (ref 3.5–5.0)
Alkaline Phosphatase: 39 U/L (ref 38–126)
Bilirubin, Direct: 0.2 mg/dL (ref 0.0–0.2)
Indirect Bilirubin: 0.4 mg/dL (ref 0.3–0.9)
Total Bilirubin: 0.6 mg/dL (ref 0.3–1.2)
Total Protein: 6.6 g/dL (ref 6.5–8.1)

## 2022-09-11 LAB — MAGNESIUM
Magnesium: 2.6 mg/dL — ABNORMAL HIGH (ref 1.7–2.4)
Magnesium: 2.6 mg/dL — ABNORMAL HIGH (ref 1.7–2.4)

## 2022-09-11 LAB — URINALYSIS, ROUTINE W REFLEX MICROSCOPIC
Bilirubin Urine: NEGATIVE
Glucose, UA: NEGATIVE mg/dL
Ketones, ur: NEGATIVE mg/dL
Leukocytes,Ua: NEGATIVE
Nitrite: NEGATIVE
Protein, ur: 100 mg/dL — AB
RBC / HPF: >50 RBC/hpf — ABNORMAL HIGH (ref 0–5)
Specific Gravity, Urine: 1.019 (ref 1.005–1.030)
pH: 5 (ref 5.0–8.0)

## 2022-09-11 LAB — PHOSPHORUS
Phosphorus: 3.6 mg/dL (ref 2.5–4.6)
Phosphorus: 4.3 mg/dL (ref 2.5–4.6)

## 2022-09-11 LAB — PROTIME-INR
INR: 1.1 (ref 0.8–1.2)
Prothrombin Time: 13.8 seconds (ref 11.4–15.2)

## 2022-09-11 LAB — BILIRUBIN, DIRECT: Bilirubin, Direct: 0.1 mg/dL (ref 0.0–0.2)

## 2022-09-11 LAB — FIBRINOGEN: Fibrinogen: 498 mg/dL — ABNORMAL HIGH (ref 210–475)

## 2022-09-11 LAB — TRIGLYCERIDES: Triglycerides: 347 mg/dL — ABNORMAL HIGH (ref <150)

## 2022-09-11 LAB — APTT: aPTT: 29 seconds (ref 24–36)

## 2022-09-11 MED ORDER — METHYLPREDNISOLONE SODIUM SUCC 40 MG IJ SOLR
40.0000 mg | Freq: Two times a day (BID) | INTRAMUSCULAR | Status: DC
Start: 2022-09-11 — End: 2022-09-12
  Administered 2022-09-11 – 2022-09-12 (×2): 40 mg via INTRAVENOUS
  Filled 2022-09-11 (×3): qty 1

## 2022-09-11 MED ORDER — SODIUM CHLORIDE 0.9 % IV SOLN: INTRAVENOUS | Status: DC | PRN

## 2022-09-11 NOTE — Progress Notes (Signed)
Neurology Progress Note  Patient ID: HAPPY KY is a 61 y.o. with PMHx of  has a past medical history of Asthma.  Interval/Subjective: -Off versed, myoclonic activity when weaning propofol to 20 for which nursing increased propofol back to 60, not seizure activity on EEG -Poor urine output per nursing  Exam: Vitals:   09/11/22 0800 09/11/22 0900  BP:    Pulse:    Resp:    Temp: 98.4 F (36.9 C) 98.4 F (36.9 C)  SpO2:     Physical Exam  Constitutional: Appears ill Psych: Not interactive Eyes: Scleral edema is mild HENT: ET tube in place MSK: no joint deformities.  Cardiovascular: Normal rate and regular rhythm.  Respiratory: Breathing comfortably on the ventilator,  GI: Soft.  No distension. There is no tenderness.  Skin: Warm dry and intact visible skin.    Neuro: Performed on 60 of propofol, off of Versed, as well as fentanyl drip  Mental Status: Does not open eyes spontaneously, to voice or noxious stimulation Does not follow any commands Cranial Nerves: II: No blink to threat. Pupils are equal, irregular, and reactive to light, 3 mm to 2.5 mm sluggish III,IV, VI/VIII: EOMI to VOR absent V/VII: Right corneal is present, left is absent VIII: No response to voice X/XI: No cough/gag XII: Unable to assess tongue protrusion secondary to patient's mental status  Motor/Sensory: Bulk is normal.  Extensor posturing in all four extremities    Pertinent Labs:  Basic Metabolic Panel: Recent Labs  Lab 10/07/2022 0237 10-07-22 0323 2022/10/07 0526 10/07/2022 0623 07-Oct-2022 1001 Oct 07, 2022 1708 09/10/22 0739 09/10/22 0745 09/11/22 0550  NA 138   < >  --  138 140 141  --  139 142  K 5.2*   < >  --  3.9 4.0 3.7  --  3.9 4.6  CL 107  --   --   --  107  --   --  105 109  CO2 18*  --   --   --  22  --   --  22 19*  GLUCOSE 229*  --   --   --  172*  --   --  175* 163*  BUN 18  --   --   --  21*  --   --  20 30*  CREATININE 1.19  --  0.72  --  0.99  --   --  0.87 0.89   CALCIUM 8.4*  --   --   --  8.4*  --   --  8.2* 8.8*  MG  --   --   --   --  2.2  --   --   --  2.6*  PHOS  --   --   --   --   --   --  3.5  --  3.6   < > = values in this interval not displayed.    Lab Results  Component Value Date   ALT 121 (H) 10/07/22   AST 148 (H) October 07, 2022   ALKPHOS 59 10/07/2022   BILITOT 0.3 10/07/2022       Ammonia 9/10 -- 67  CBC: Recent Labs  Lab 10-07-2022 0237 10-07-2022 0323 07-Oct-2022 0526 2022/10/07 0623 10/07/22 1708 09/10/22 0745 09/11/22 0550  WBC 14.1*  --  15.3*  --   --  18.6* 21.9*  NEUTROABS 7.1  --   --   --   --   --   --   HGB 14.0   < >  13.2 15.6 13.9 13.3 14.7  HCT 44.4   < > 40.9 46.0 41.0 39.7 42.1  MCV 96.1  --  94.2  --   --  88.6 87.7  PLT 281  --  105*  --   --  213 201   < > = values in this interval not displayed.     Coagulation Studies: No results for input(s): "LABPROT", "INR" in the last 72 hours.   EEG personally reviewed, 9/10-9/11 Patient was noted to have episodes of sudden eye opening with whole body jerking every 10-20 seconds lasting 5-15 seconds. Concomitant EEG showed generalized polyspikes consistent with myoclonic seizures.  In between seizures EEG showed generalized background suppression.  As sedation was adjusted, clinical jerking/seizures stopped.  EEG then showed burst suppression pattern with highly epileptiform bursts lasting 10-15 seconds alternating with EEG suppression lasting 5 to 15 seconds.  After around 1800, the duration of bursts became smaller and rare 1 second bursts were noted with near continuous background suppression which was not reactive to stimulation.  Gradually after around 2 AM, EEG showed burst suppression with bursts of polymorphic 3 to 5 Hz  theta-delta slowing lasting 1 to 2 seconds alternating with 3 to 5 seconds of generalized EEG suppression hyperventilation and photic stimulation were not performed.     EEG 9/11 - 9/12 EEG showed continuous generalized background  suppression, not reactive to tactile stimulation.  Hyperventilation and photic stimulation were not performed.     Patient event button was pressed on 09/11/2022 at 0556.  Per RN patient had posturing of bilateral upper extremities.  Concomitant EEG before, during and after the event did not show any EEG to suggest seizure.   Assessment: 61 year old polysubstance abuse patient presented with cardiac arrest with unknown downtime requiring 20 minutes of CPR prior to ROSC, with EEG concerning for myoclonic status, now suppressed. Exam worsening from decorticate to decrebrate posturing and EEG now flat and non-reactive off sedation again c/w expected evolution of severe anoxic brain injury. Discussed with girlfriend at bedside and CCM via secure chat.   Impression: -Postanoxic injury myoclonic seizures, resolved,  -Postanoxic injury myoclonic movements, ongoing -Likely substance related cardiac arrest secondary to cocaine/opiates -On targeted temperature management for normothermia and supportive care -Ongoing goals of care discussion -Asthma exacerbation on high-dose steroids -Concern for strep bacteremia versus contaminant on empiric ceftriaxone -Transaminitis with concern for shock liver with hyperammonemia (will limit use of Depakote) -Thrombocytopenia secondary to acute illness, improving  Recommendations: - s/p Keppra 4500 mg load, continue 1500 mg BID. Will adjust as needed for renal function:  Estimated Creatinine Clearance: 97.5 mL/min (by C-G formula based on SCr of 0.89 mg/dL).   CrCl 80 to 130 mL/minute/1.73 m2: 500 mg to 1.5 g every 12 hours.  CrCl 50 to <80 mL/minute/1.73 m2: 500 mg to 1 g every 12 hours.  CrCl 30 to <50 mL/minute/1.73 m2: 250 to 750 mg every 12 hours.  CrCl 15 to <30 mL/minute/1.73 m2: 250 to 500 mg every 12 hours.  CrCl <15 mL/minute/1.73 m2: 250 to 500 mg every 24 hours (expert opinion). -LFTs ordered, will add depakote if relatively stable for control of  myoclonic movements (not seizures) if patient not transitioning to comfort care -Propofol currently at 60, reduce to 40, okay to increase to 60 for MRI  -Neurology will continue to follow, appreciate CCM care of comorbidities  Brooke Dare MD-PhD [facility] [phone]   CRITICAL CARE Performed by: Gordy Councilman   Total critical care time: 35  minutes  Critical care time was exclusive of separately billable procedures and treating other patients.  Critical care was necessary to treat or prevent imminent or life-threatening deterioration.  Critical care was time spent personally by me on the following activities: development of treatment plan with patient and/or surrogate as well as nursing, discussions with consultants, evaluation of patient's response to treatment, examination of patient, obtaining history from patient or surrogate, ordering and performing treatments and interventions, ordering and review of laboratory studies, ordering and review of radiographic studies, pulse oximetry and re-evaluation of patient's condition.  Discussed with CCM team via secure chat

## 2022-09-11 NOTE — Progress Notes (Signed)
Sputum culture sent to lab

## 2022-09-11 NOTE — Procedures (Signed)
Arterial Catheter Insertion Procedure Note  HARVIE MORUA  [id number]  Oct 17, 1961  Date:09/11/22  Time:8:36 PM    Provider Performing: Inez Pilgrim    Procedure: Insertion of Arterial Line (00923) without US guidance  Indication(s) Blood pressure monitoring and/or need for frequent ABGs  Consent Unable to obtain consent due to inability to find a medical decision maker for patient.  All reasonable efforts were made.  Another independent medical provider,   , confirmed the benefits of this procedure outweigh the risks.  Anesthesia None   Time Out Verified patient identification, verified procedure, site/side was marked, verified correct patient position, special equipment/implants available, medications/allergies/relevant history reviewed, required imaging and test results available.   Sterile Technique Maximal sterile technique including full sterile barrier drape, hand hygiene, sterile gown, sterile gloves, mask, hair covering, sterile ultrasound probe cover (if used).   Procedure Description Area of catheter insertion was cleaned with chlorhexidine and draped in sterile fashion. Without real-time ultrasound guidance an arterial catheter was placed into the left radial artery.  Appropriate arterial tracings confirmed on monitor.     Complications/Tolerance None; patient tolerated the procedure well.   EBL Minimal   Specimen(s) None

## 2022-09-11 NOTE — Progress Notes (Signed)
DNR bracelet placed on patient 

## 2022-09-11 NOTE — Procedures (Addendum)
Patient Name: Darren Lane  MRN: [id number]  Epilepsy Attending: Charlsie Quest  Referring Physician/Provider: Steffanie Dunn, DO  Duration: 09/10/2022 0825 to 09/11/2022 0825   Patient history: 61yo M s/p cardiac arrest. EEG to evaluate for seizure   Level of alertness: comatose   AEDs during EEG study: propofol, versed, LEV   Technical aspects: This EEG study was done with scalp electrodes positioned according to the 10-20 International system of electrode placement. Electrical activity was reviewed with band pass filter of 1-70Hz , sensitivity of 7 uV/mm, display speed of 79mm/sec with a 60Hz  notched filter applied as appropriate. EEG data were recorded continuously and digitally stored.  Video monitoring was available and reviewed as appropriate.   Description: EEG showed continuous generalized background suppression, not reactive to tactile stimulation.  Hyperventilation and photic stimulation were not performed.     Patient event button was pressed on 09/11/2022 at 0556.  Per RN patient had posturing of bilateral upper extremities.  Concomitant EEG before, during and after the event did not show any EEG to suggest seizure.   ABNORMALITY -Background suppression, generalized   IMPRESSION: This study was suggestive of profound diffuse encephalopathy.  No definite seizures were seen during the study.  One event was recorded as described above without concomitant EEG change.  This was most likely not an epileptic event.  [name] 11/11/2022

## 2022-09-11 NOTE — Progress Notes (Addendum)
LTM EEG discontinued - no skin breakdown at [facility].  Alcohol foam from sink and two containers of cotton balls and acetone used to d/c with some glue residue remaining. Pt. Wife was giving further instructions about continued glue removal and shampoo cleaning.

## 2022-09-11 NOTE — Progress Notes (Signed)
NAME:  Darren Lane, MRN:  [id number], DOB:  1961-07-18, LOS: 2 ADMISSION DATE:  09/26/2022, CONSULTATION DATE:  9/10 REFERRING MD:  Dr. Nicanor Alcon, CHIEF COMPLAINT:  post arrest   History of Present Illness:  Patient is a 61 year old male with pertinent PMH of asthma and polysubstance abuse presents to [facility] ED on 9/10 post arrest.  On 9/10, patient was reportedly found down by a girlfriend who states patient was found patient down unresponsive and pulseless. Reports that he was using fentanyl earlier. Unwitnessed arrest. EMS called and found patient pulseless and asystole. Powder appreciated on floor on scene.  CPR started and [facility] airway placed.  Epi given.  ROSC about 20 minutes.  Patient transferred to [facility] ED.  Upon arrival to [facility] ED on 9/10 patient with [facility] airway.  UDS was obtained prior to giving RSI medications for ETT exchange.  Patient initial BP 189/108, HR 107.  Breathing spontaneously but diminished wheezing present.  GCS 1.  CXR appears clear with ETT in good position.  CT head with no acute abnormality.  EKG with no elevated ST.  LA 7, K5.2, glucose 229, AST 148, ALT 121.  Salicylate, acetaminophen, ethanol WNL.  UDS positive for cocaine and opiates.  WBC 14.1.  Troponin 31.  ABG 7.10, 81, 510, 26.  PCCM consulted for ICU admission.  Pertinent  Medical History  polysubstance abuse Asthma Retinal detachment of right eye  Significant Hospital Events: Including procedures, antibiotic start and stop dates in addition to other pertinent events   9/10: post arrest; unknown down time; found with fentanyl and cocaine in UDS; EMS called found pulseless in asystole; king airway placed and performed CPR for about 20 minutes until rosc. Admitted to [facility]. 9/11 EEG with myoclonic seizures and profound encephalopathy suggestive of anoxic injury. 9/11 Versed weaned off but Propofol remains at 60, when weaned myoclonic and decerebrate posturing noted.  Interim History / Subjective:  Propofol  attempted to be weaned overnight but pt with decerebrate posturing of upper extremities. Brain MRI today.  Objective   Blood pressure (!) 147/83, pulse 85, temperature 98.6 F (37 C), resp. rate (!) 28, height 5\' 9"  (1.753 m), weight 86.8 kg, SpO2 98 %.    Vent Mode: PRVC FiO2 (%):  [40 %-50 %] 40 % Set Rate:  [0 bmp-30 bmp] 0 bmp Vt Set:  [560 mL] 560 mL PEEP:  [5 cmH20] 5 cmH20 Plateau Pressure:  [15 cmH20-19 cmH20] 17 cmH20   Intake/Output Summary (Last 24 hours) at 09/11/2022 0830 Last data filed at 09/11/2022 0600 Gross per 24 hour  Intake 994.16 ml  Output 36 ml  Net 958.16 ml    Filed Weights   09/05/2022 0654 09/10/22 0500 09/11/22 0459  Weight: 88.3 kg 87.5 kg 86.8 kg    Examination: General: Adult male, critically ill, resting in bed, in NAD. Neuro: Sedated, not responsive. No cough, gag, no spontaneous respirations. Remains on high dose Propofol given posturing and myoclonus. HEENT: [place]/AT. Sclerae anicteric. ETT in place. Cardiovascular: RRR, no M/R/G.  Lungs: Respirations even and unlabored.  CTA bilaterally, No W/R/R. Abdomen: BS x 4, soft, NT/ND.  Musculoskeletal: No gross deformities, no edema.  Skin: Intact, warm, no rashes.  Assessment & Plan:   Cardiac Arrest: Unwitnessed arrest; found unresponsive, apneic, pulses were absent, found to be in asystole; CPR started and [facility] airway placed, given epi.  UDS positive for opiates and cocaine (obtained prior to starting fentanyl). Roughly 20 minutes ACLS after EMS arrival. -CT head with no acute  abnormality; UDS positive for cocaine/opiates P: -con't supportive care -tele monitoring -con't TTM for normothermia -Continue to wean Propofol as able but myoclonus and posturing has limited this - Brain MRI today - Discussed goals of care with family 9/11, see separate IPAL note. Attempted to call sister again today 9/12 but no answer, will try back later.  Acute respiratory failure with hypercarbia and hypoxia on  MV Asthma exacerbation P: -LTVV -VAP prevention protocol -PAD protocol for sedation -daily SAT & SBT as able; not stable for this yet due to high sedation and mental status -wean vent as able to maintain SpO2 -duonebs Q4h -con't Solumedrol but lower from 125 BID to 40 BID  Likely strep bacteremia vs contaminant though favor real given repeat cultures growing GPC's also -empiric ceftriaxone -con't to follow cultures  Acute encephalopathy with concern for significant anoxic injury Polysubstance abuse Myoclonic seizures -Postarrest unknown downtime; likely hypercarbia related from drug overdose suppressing respirations; UDS positive for cocaine/opiates -CT head with no acute abnormality; ethanol, salicylate, acetaminophen level WNL P: -neuro following, appreciate their recommendations -Continue Keppra, Propofol. Have attempted to wean Propofol but myoclonus and posturing noted with weaning -EEG ongoing per neuro -Brain MRI today -lactulose for hyperammonemia  Hyperglycemia, prediabetes (A1c 6.4) P: -SSI PRN -goal BG 140-180  Transaminase elevation likely 2/2 shock liver hyperammonemia P: -ammonia -avoid hepatotoxic meds -LFT's intermittently  History of CVA -aspirin, hold statin until liver function improves   Best Practice (right click and "Reselect all SmartList Selections" daily)   Diet/type: NPO w/ meds via tube DVT prophylaxis: prophylactic heparin  GI prophylaxis: PPI Lines: N/A Foley:  Yes, and it is still needed Code Status:  full code Last date of multidisciplinary goals of care discussion [9/10- con't full code, discussing code status].  See IPAL note from 9/11. Called sister 9/12 but no answer, will attempt again later. Did update wife at bedside.   CC time: 30 min.   [name], PA - C [place] [facility] For pager details, please see AMION or use Epic chat  After 1900, please call [facility] for cross coverage needs 09/11/2022,  8:30 AM

## 2022-09-11 NOTE — Progress Notes (Signed)
Patient's sister notified this RN Dava Najjar they would like to change code status to DNR. Patient significant other at bedside during this conversation. PA Rahul notified of family request.

## 2022-09-11 NOTE — Progress Notes (Signed)
Patient  had very minimal urine output from foley. Patient abdomen also distended. Bladder scan done with >999 ml of urine. 20 ml of saline flushed in foley with return of 1 L of urine. Line was clamped for 30 minutes and unclamped with an additional 850 ml of urine. Patient abdomen less distended. Will continue to monitor.

## 2022-09-11 NOTE — Procedures (Addendum)
Patient Name: RAFFAEL BUGARIN  MRN: [id number]  Epilepsy Attending: Charlsie Quest  Referring Physician/Provider: Steffanie Dunn, DO  Duration: 09/11/2022 0825 to 09/11/2022 1120   Patient history: 61yo M s/p cardiac arrest. EEG to evaluate for seizure   Level of alertness: comatose   AEDs during EEG study: propofol, LEV   Technical aspects: This EEG study was done with scalp electrodes positioned according to the 10-20 International system of electrode placement. Electrical activity was reviewed with band pass filter of 1-70Hz , sensitivity of 7 uV/mm, display speed of 43mm/sec with a 60Hz  notched filter applied as appropriate. EEG data were recorded continuously and digitally stored.  Video monitoring was available and reviewed as appropriate.   Description: EEG showed continuous generalized background suppression, not reactive to tactile stimulation.  Hyperventilation and photic stimulation were not performed.      Patient event button was pressed on 09/11/2022 at 0556.  Per RN patient had posturing of bilateral upper extremities.  Concomitant EEG before, during and after the event did not show any EEG to suggest seizure.   ABNORMALITY -Background suppression, generalized   IMPRESSION: This study was suggestive of profound diffuse encephalopathy.  No definite seizures were seen during the study.  Tishara Pizano 11/11/2022

## 2022-09-11 NOTE — IPAL (Signed)
  Interdisciplinary Goals of Care Family Meeting   Date carried out: 09/11/2022  Location of the meeting: Unit  Member's involved: Family Member or next of kin  Durable Power of Attorney or Environmental health practitioner: Pt's significant other (previously was thought to be his wife).    Discussion: We discussed goals of care for Darren Lane .  Updated significant other Darren Lane and pt's sister Darren Lane regarding worsened neurological exam with myoclonus overnight and posturing this morning. Informed them of plan for brain MRI this afternoon at 1400 with expectation that this will be consistent with findings suggestive of significant anoxic encephalopathy. Family understands. They are currently contemplating DNR in the interim and will await formal MRI read. If MRI does indeed confirm suspected anoxic injury, they will likely transition to comfort care this evening versus tomorrow morning.  Code status: Full Code for now. Family to update RN once they have talked again regarding whether they would like to transition to DNR while awaiting brain MRI.  Disposition: Continue current acute care  Time spent for the meeting: 20 min.    [name], PA-C  09/11/2022, 1:03 PM

## 2022-09-11 NOTE — Progress Notes (Signed)
Patient transported from 2H12 to MRI and back with no complications.

## 2022-09-11 NOTE — Progress Notes (Signed)
CSW received consult for patient regarding utilities resources. Due to patients current orientation CSW spoke with patients significant other. Patients significant other reports no current needs/resources needed/identified  at this time for patient. CSW will continue to follow. If any other needs arise please contact TOC. All questions answered. No further questions reported at this time.

## 2022-09-12 DIAGNOSIS — Z66 Do not resuscitate: Secondary | ICD-10-CM

## 2022-09-12 DIAGNOSIS — G253 Myoclonus: Secondary | ICD-10-CM

## 2022-09-12 DIAGNOSIS — D72829 Elevated white blood cell count, unspecified: Secondary | ICD-10-CM

## 2022-09-12 LAB — URINE CULTURE: Culture: NO GROWTH

## 2022-09-12 LAB — PHOSPHORUS
Phosphorus: 4 mg/dL (ref 2.5–4.6)
Phosphorus: 3.9 mg/dL (ref 2.5–4.6)
Phosphorus: 4.4 mg/dL (ref 2.5–4.6)
Phosphorus: 3.6 mg/dL (ref 2.5–4.6)

## 2022-09-12 LAB — POCT I-STAT 7, (LYTES, BLD GAS, ICA,H+H)
Acid-Base Excess: 2 mmol/L (ref 0.0–2.0)
Bicarbonate: 25 mmol/L (ref 20.0–28.0)
Calcium, Ion: 1.16 mmol/L (ref 1.15–1.40)
HCT: 37 % — ABNORMAL LOW (ref 39.0–52.0)
Hemoglobin: 12.6 g/dL — ABNORMAL LOW (ref 13.0–17.0)
O2 Saturation: 95 %
Patient temperature: 36.9
Potassium: 4.9 mmol/L (ref 3.5–5.1)
Sodium: 143 mmol/L (ref 135–145)
TCO2: 26 mmol/L (ref 22–32)
pCO2 arterial: 34.5 mmHg (ref 32–48)
pH, Arterial: 7.468 — ABNORMAL HIGH (ref 7.35–7.45)
pO2, Arterial: 68 mmHg — ABNORMAL LOW (ref 83–108)

## 2022-09-12 LAB — CBC WITH DIFFERENTIAL/PLATELET
Abs Immature Granulocytes: 0.22 10*3/uL — ABNORMAL HIGH (ref 0.00–0.07)
Abs Immature Granulocytes: 0.16 10*3/uL — ABNORMAL HIGH (ref 0.00–0.07)
Abs Immature Granulocytes: 0.22 10*3/uL — ABNORMAL HIGH (ref 0.00–0.07)
Abs Immature Granulocytes: 0.21 10*3/uL — ABNORMAL HIGH (ref 0.00–0.07)
Basophils Absolute: 0 10*3/uL (ref 0.0–0.1)
Basophils Absolute: 0 10*3/uL (ref 0.0–0.1)
Basophils Absolute: 0 10*3/uL (ref 0.0–0.1)
Basophils Absolute: 0 10*3/uL (ref 0.0–0.1)
Basophils Relative: 0 %
Basophils Relative: 0 %
Basophils Relative: 0 %
Basophils Relative: 0 %
Eosinophils Absolute: 0 10*3/uL (ref 0.0–0.5)
Eosinophils Absolute: 0 10*3/uL (ref 0.0–0.5)
Eosinophils Absolute: 0 10*3/uL (ref 0.0–0.5)
Eosinophils Absolute: 0 10*3/uL (ref 0.0–0.5)
Eosinophils Relative: 0 %
Eosinophils Relative: 0 %
Eosinophils Relative: 0 %
Eosinophils Relative: 0 %
HCT: 39.9 % (ref 39.0–52.0)
HCT: 39.6 % (ref 39.0–52.0)
HCT: 39.7 % (ref 39.0–52.0)
HCT: 40 % (ref 39.0–52.0)
Hemoglobin: 13.7 g/dL (ref 13.0–17.0)
Hemoglobin: 13.2 g/dL (ref 13.0–17.0)
Hemoglobin: 13.4 g/dL (ref 13.0–17.0)
Hemoglobin: 13 g/dL (ref 13.0–17.0)
Immature Granulocytes: 1 %
Immature Granulocytes: 1 %
Immature Granulocytes: 1 %
Immature Granulocytes: 1 %
Lymphocytes Relative: 3 %
Lymphocytes Relative: 4 %
Lymphocytes Relative: 3 %
Lymphocytes Relative: 7 %
Lymphs Abs: 0.6 10*3/uL — ABNORMAL LOW (ref 0.7–4.0)
Lymphs Abs: 0.6 10*3/uL — ABNORMAL LOW (ref 0.7–4.0)
Lymphs Abs: 0.5 10*3/uL — ABNORMAL LOW (ref 0.7–4.0)
Lymphs Abs: 1.1 10*3/uL (ref 0.7–4.0)
MCH: 30.6 pg (ref 26.0–34.0)
MCH: 29.9 pg (ref 26.0–34.0)
MCH: 30.1 pg (ref 26.0–34.0)
MCH: 29.4 pg (ref 26.0–34.0)
MCHC: 34.3 g/dL (ref 30.0–36.0)
MCHC: 33.3 g/dL (ref 30.0–36.0)
MCHC: 33.8 g/dL (ref 30.0–36.0)
MCHC: 32.5 g/dL (ref 30.0–36.0)
MCV: 89.1 fL (ref 80.0–100.0)
MCV: 89.6 fL (ref 80.0–100.0)
MCV: 89.2 fL (ref 80.0–100.0)
MCV: 90.5 fL (ref 80.0–100.0)
Monocytes Absolute: 1.3 10*3/uL — ABNORMAL HIGH (ref 0.1–1.0)
Monocytes Absolute: 1.2 10*3/uL — ABNORMAL HIGH (ref 0.1–1.0)
Monocytes Absolute: 0.9 10*3/uL (ref 0.1–1.0)
Monocytes Absolute: 1.6 10*3/uL — ABNORMAL HIGH (ref 0.1–1.0)
Monocytes Relative: 7 %
Monocytes Relative: 7 %
Monocytes Relative: 5 %
Monocytes Relative: 10 %
Neutro Abs: 17.1 10*3/uL — ABNORMAL HIGH (ref 1.7–7.7)
Neutro Abs: 14.6 10*3/uL — ABNORMAL HIGH (ref 1.7–7.7)
Neutro Abs: 14.5 10*3/uL — ABNORMAL HIGH (ref 1.7–7.7)
Neutro Abs: 13.6 10*3/uL — ABNORMAL HIGH (ref 1.7–7.7)
Neutrophils Relative %: 89 %
Neutrophils Relative %: 88 %
Neutrophils Relative %: 91 %
Neutrophils Relative %: 82 %
Platelets: 192 10*3/uL (ref 150–400)
Platelets: 200 10*3/uL (ref 150–400)
Platelets: 202 10*3/uL (ref 150–400)
Platelets: 209 10*3/uL (ref 150–400)
RBC: 4.48 MIL/uL (ref 4.22–5.81)
RBC: 4.42 MIL/uL (ref 4.22–5.81)
RBC: 4.45 MIL/uL (ref 4.22–5.81)
RBC: 4.42 MIL/uL (ref 4.22–5.81)
RDW: 13.6 % (ref 11.5–15.5)
RDW: 13.5 % (ref 11.5–15.5)
RDW: 13.5 % (ref 11.5–15.5)
RDW: 13.3 % (ref 11.5–15.5)
WBC: 19.2 10*3/uL — ABNORMAL HIGH (ref 4.0–10.5)
WBC: 16.6 10*3/uL — ABNORMAL HIGH (ref 4.0–10.5)
WBC: 16.1 10*3/uL — ABNORMAL HIGH (ref 4.0–10.5)
WBC: 16.6 10*3/uL — ABNORMAL HIGH (ref 4.0–10.5)
nRBC: 0 % (ref 0.0–0.2)
nRBC: 0 % (ref 0.0–0.2)
nRBC: 0 % (ref 0.0–0.2)
nRBC: 0 % (ref 0.0–0.2)

## 2022-09-12 LAB — COMPREHENSIVE METABOLIC PANEL
ALT: 66 U/L — ABNORMAL HIGH (ref 0–44)
ALT: 58 U/L — ABNORMAL HIGH (ref 0–44)
ALT: 57 U/L — ABNORMAL HIGH (ref 0–44)
ALT: 51 U/L — ABNORMAL HIGH (ref 0–44)
AST: 94 U/L — ABNORMAL HIGH (ref 15–41)
AST: 89 U/L — ABNORMAL HIGH (ref 15–41)
AST: 89 U/L — ABNORMAL HIGH (ref 15–41)
AST: 84 U/L — ABNORMAL HIGH (ref 15–41)
Albumin: 3 g/dL — ABNORMAL LOW (ref 3.5–5.0)
Albumin: 2.9 g/dL — ABNORMAL LOW (ref 3.5–5.0)
Albumin: 2.9 g/dL — ABNORMAL LOW (ref 3.5–5.0)
Albumin: 2.8 g/dL — ABNORMAL LOW (ref 3.5–5.0)
Alkaline Phosphatase: 36 U/L — ABNORMAL LOW (ref 38–126)
Alkaline Phosphatase: 38 U/L (ref 38–126)
Alkaline Phosphatase: 38 U/L (ref 38–126)
Alkaline Phosphatase: 33 U/L — ABNORMAL LOW (ref 38–126)
Anion gap: 10 (ref 5–15)
Anion gap: 8 (ref 5–15)
Anion gap: 5 (ref 5–15)
Anion gap: 6 (ref 5–15)
BUN: 34 mg/dL — ABNORMAL HIGH (ref 6–20)
BUN: 32 mg/dL — ABNORMAL HIGH (ref 6–20)
BUN: 29 mg/dL — ABNORMAL HIGH (ref 6–20)
BUN: 28 mg/dL — ABNORMAL HIGH (ref 6–20)
CO2: 23 mmol/L (ref 22–32)
CO2: 24 mmol/L (ref 22–32)
CO2: 25 mmol/L (ref 22–32)
CO2: 26 mmol/L (ref 22–32)
Calcium: 8.5 mg/dL — ABNORMAL LOW (ref 8.9–10.3)
Calcium: 8.4 mg/dL — ABNORMAL LOW (ref 8.9–10.3)
Calcium: 8.2 mg/dL — ABNORMAL LOW (ref 8.9–10.3)
Calcium: 8.4 mg/dL — ABNORMAL LOW (ref 8.9–10.3)
Chloride: 111 mmol/L (ref 98–111)
Chloride: 111 mmol/L (ref 98–111)
Chloride: 111 mmol/L (ref 98–111)
Chloride: 111 mmol/L (ref 98–111)
Creatinine, Ser: 0.77 mg/dL (ref 0.61–1.24)
Creatinine, Ser: 0.76 mg/dL (ref 0.61–1.24)
Creatinine, Ser: 0.67 mg/dL (ref 0.61–1.24)
Creatinine, Ser: 0.65 mg/dL (ref 0.61–1.24)
GFR, Estimated: >60 mL/min (ref >60)
GFR, Estimated: >60 mL/min (ref >60)
GFR, Estimated: >60 mL/min (ref >60)
GFR, Estimated: >60 mL/min (ref >60)
Glucose, Bld: 154 mg/dL — ABNORMAL HIGH (ref 70–99)
Glucose, Bld: 131 mg/dL — ABNORMAL HIGH (ref 70–99)
Glucose, Bld: 147 mg/dL — ABNORMAL HIGH (ref 70–99)
Glucose, Bld: 109 mg/dL — ABNORMAL HIGH (ref 70–99)
Potassium: 4.6 mmol/L (ref 3.5–5.1)
Potassium: 4.8 mmol/L (ref 3.5–5.1)
Potassium: 4.7 mmol/L (ref 3.5–5.1)
Potassium: 4.6 mmol/L (ref 3.5–5.1)
Sodium: 144 mmol/L (ref 135–145)
Sodium: 143 mmol/L (ref 135–145)
Sodium: 141 mmol/L (ref 135–145)
Sodium: 143 mmol/L (ref 135–145)
Total Bilirubin: 0.3 mg/dL (ref 0.3–1.2)
Total Bilirubin: 0.3 mg/dL (ref 0.3–1.2)
Total Bilirubin: 0.5 mg/dL (ref 0.3–1.2)
Total Bilirubin: 0.3 mg/dL (ref 0.3–1.2)
Total Protein: 5.8 g/dL — ABNORMAL LOW (ref 6.5–8.1)
Total Protein: 5.9 g/dL — ABNORMAL LOW (ref 6.5–8.1)
Total Protein: 5.8 g/dL — ABNORMAL LOW (ref 6.5–8.1)
Total Protein: 5.6 g/dL — ABNORMAL LOW (ref 6.5–8.1)

## 2022-09-12 LAB — MAGNESIUM
Magnesium: 2.5 mg/dL — ABNORMAL HIGH (ref 1.7–2.4)
Magnesium: 2.4 mg/dL (ref 1.7–2.4)
Magnesium: 2.3 mg/dL (ref 1.7–2.4)
Magnesium: 2.2 mg/dL (ref 1.7–2.4)

## 2022-09-12 LAB — TROPONIN I (HIGH SENSITIVITY)
Troponin I (High Sensitivity): 26 ng/L — ABNORMAL HIGH (ref <18)
Troponin I (High Sensitivity): 27 ng/L — ABNORMAL HIGH (ref <18)
Troponin I (High Sensitivity): 31 ng/L — ABNORMAL HIGH (ref <18)
Troponin I (High Sensitivity): 30 ng/L — ABNORMAL HIGH (ref <18)

## 2022-09-12 LAB — CK TOTAL AND CKMB (NOT AT ARMC)
CK, MB: 5.2 ng/mL — ABNORMAL HIGH (ref 0.5–5.0)
CK, MB: 5.3 ng/mL — ABNORMAL HIGH (ref 0.5–5.0)
CK, MB: 4.1 ng/mL (ref 0.5–5.0)
CK, MB: 3.7 ng/mL (ref 0.5–5.0)
Relative Index: 0.7 (ref 0.0–2.5)
Relative Index: 0.9 (ref 0.0–2.5)
Relative Index: 1 (ref 0.0–2.5)
Relative Index: 1.1 (ref 0.0–2.5)
Total CK: 736 U/L — ABNORMAL HIGH (ref 49–397)
Total CK: 570 U/L — ABNORMAL HIGH (ref 49–397)
Total CK: 408 U/L — ABNORMAL HIGH (ref 49–397)
Total CK: 341 U/L (ref 49–397)

## 2022-09-12 LAB — CULTURE, BLOOD (ROUTINE X 2): Special Requests: ADEQUATE

## 2022-09-12 LAB — GLUCOSE, CAPILLARY
Glucose-Capillary: 105 mg/dL — ABNORMAL HIGH (ref 70–99)
Glucose-Capillary: 114 mg/dL — ABNORMAL HIGH (ref 70–99)
Glucose-Capillary: 131 mg/dL — ABNORMAL HIGH (ref 70–99)
Glucose-Capillary: 135 mg/dL — ABNORMAL HIGH (ref 70–99)
Glucose-Capillary: 146 mg/dL — ABNORMAL HIGH (ref 70–99)
Glucose-Capillary: 152 mg/dL — ABNORMAL HIGH (ref 70–99)
Glucose-Capillary: 158 mg/dL — ABNORMAL HIGH (ref 70–99)

## 2022-09-12 LAB — LACTATE DEHYDROGENASE
LDH: 407 U/L — ABNORMAL HIGH (ref 98–192)
LDH: 423 U/L — ABNORMAL HIGH (ref 98–192)
LDH: 432 U/L — ABNORMAL HIGH (ref 98–192)
LDH: 440 U/L — ABNORMAL HIGH (ref 98–192)

## 2022-09-12 LAB — BILIRUBIN, DIRECT
Bilirubin, Direct: <0.1 mg/dL (ref 0.0–0.2)
Bilirubin, Direct: <0.1 mg/dL (ref 0.0–0.2)
Bilirubin, Direct: <0.1 mg/dL (ref 0.0–0.2)
Bilirubin, Direct: <0.1 mg/dL (ref 0.0–0.2)

## 2022-09-12 LAB — PROTIME-INR
INR: 1 (ref 0.8–1.2)
INR: 1 (ref 0.8–1.2)
INR: 1 (ref 0.8–1.2)
INR: 1 (ref 0.8–1.2)
Prothrombin Time: 13 seconds (ref 11.4–15.2)
Prothrombin Time: 13.5 seconds (ref 11.4–15.2)
Prothrombin Time: 13 seconds (ref 11.4–15.2)
Prothrombin Time: 13 seconds (ref 11.4–15.2)

## 2022-09-12 LAB — APTT
aPTT: 27 seconds (ref 24–36)
aPTT: 28 seconds (ref 24–36)
aPTT: 26 seconds (ref 24–36)
aPTT: 28 seconds (ref 24–36)

## 2022-09-12 LAB — SARS CORONAVIRUS 2 BY RT PCR: SARS Coronavirus 2 by RT PCR: NEGATIVE

## 2022-09-12 LAB — TRIGLYCERIDES: Triglycerides: 378 mg/dL — ABNORMAL HIGH (ref <150)

## 2022-09-12 MED ORDER — PREDNISONE 20 MG PO TABS
40.0000 mg | ORAL_TABLET | Freq: Every day | ORAL | Status: DC
  Administered 2022-09-12: 40 mg
  Filled 2022-09-12: qty 2

## 2022-09-12 MED ORDER — LACTATED RINGERS IV SOLN: INTRAVENOUS | Status: DC

## 2022-09-12 NOTE — Progress Notes (Addendum)
NAME:  Darren Lane, MRN:  [id number], DOB:  1961/03/11, LOS: 3 ADMISSION DATE:  09/27/2022, CONSULTATION DATE:  9/10 REFERRING MD:  Dr. Nicanor Alcon, CHIEF COMPLAINT:  post arrest   History of Present Illness:  Patient is a 61 year old male with pertinent PMH of asthma and polysubstance abuse presents to [facility] ED on 9/10 post arrest.  Found down 9/10 by a girlfriend unresponsive and pulseless. Reports that he was using fentanyl earlier. Unwitnessed arrest. EMS called and found patient pulseless and asystole. Powder appreciated on floor on scene.  CPR started and [facility] airway placed.  Epi given.  ROSC about 20 minutes.  Patient transferred to [facility] ED.  Upon arrival to [facility] ED on 9/10 patient with [facility] airway.  UDS was obtained prior to giving RSI medications for ETT exchange.  Patient initial BP 189/108, HR 107.  Breathing spontaneously but diminished wheezing present.  GCS 1.  CXR appears clear with ETT in good position.  CT head with no acute abnormality.  EKG with no elevated ST.  LA 7, K5.2, glucose 229, AST 148, ALT 121.  Salicylate, acetaminophen, ethanol WNL.  UDS positive for cocaine and opiates.  WBC 14.1.  Troponin 31.  ABG 7.10, 81, 510, 26.  PCCM consulted for ICU admission.  Pertinent  Medical History  polysubstance abuse Asthma Retinal detachment of right eye  Significant Hospital Events: Including procedures, antibiotic start and stop dates in addition to other pertinent events   9/10: post arrest; unknown down time; found with fentanyl and cocaine in UDS; EMS called found pulseless in asystole; king airway placed and performed CPR for about 20 minutes until rosc. Admitted to [facility]. 9/11 EEG with myoclonic seizures and profound encephalopathy suggestive of anoxic injury. 9/11 Versed weaned off but Propofol remains at 60, when weaned myoclonic and decerebrate posturing noted. 9/12 MRI with diffuse restricted diffusion involving the cortex and bilateral basal ganglia as well as the thalami  raising concern for hypoxic/ischemic injury there was mild cerebral edema without mass effect.  CT chest showing patchy bibasilar airspace disease.  Mild hepatic steatosis with focal fatty sparing.  EEG suggesting profound/diffuse encephalopathy but no seizure.  Goals of care discussion completed Family decided on DNR  Interim History / Subjective:  No changes.  Decerebrate posturing with noxious stimulus Objective   Blood pressure 125/74, pulse 65, temperature 98.6 F (37 C), resp. rate (Abnormal) 25, height 5\' 9"  (1.753 m), weight 79.4 kg, SpO2 95 %.    Vent Mode: PRVC FiO2 (%):  [40 %] 40 % Set Rate:  [24 bmp-30 bmp] 24 bmp Vt Set:  [560 mL] 560 mL PEEP:  [5 cmH20] 5 cmH20 Plateau Pressure:  [16 cmH20-27 cmH20] 18 cmH20   Intake/Output Summary (Last 24 hours) at 09/12/2022 1040 Last data filed at 09/12/2022 1000 Gross per 24 hour  Intake 1553.49 ml  Output 4215 ml  Net -2661.51 ml   Filed Weights   09/11/22 0459 09/12/22 0000 09/12/22 0428  Weight: 86.8 kg 79.4 kg 79.4 kg    Examination: General: This is a 61 year old male patient who remains comatose status post cardiac arrest HEENT: Normocephalic atraumatic orally intubated pupils equal reactive Neuro: Decerebrate posturing to noxious stimulus Pulmonary: Scattered rhonchi no accessory use minute ventilation Cardiac: Regular rate and rhythm without audible murmur rub or gallop Abdomen: Soft nontender no organomegaly GU: Foley catheter in place with concentrated urine Extremities: Warm dry trace lower extremity edema  Assessment & Plan:  Principal Problem:   Cardiac arrest [facility]) Active Problems:  Overdose of undetermined intent   Anoxic encephalopathy (HCC)   Myoclonic jerking   Aspiration pneumonia (HCC)   Acute respiratory failure with hypoxia (HCC)   Transaminitis   Hyperglycemia   History of CVA (cerebrovascular accident)   Leukocytosis   DNR (do not resuscitate)   Status post cardiac Arrest, PEA secondary  to drug overdose Plan Continue telemetry monitoring Normothermia Serial neurochecks DNR status We will continue to discuss goals of care with family  Acute respiratory failure with hypercarbia and hypoxia on MV Asthma exacerbation and possible aspiration pneumonia -Mild respiratory alkalosis on ABG Plan Continue full ventilator support, Ve changed  VAP bundle PAD protocol RASS goal 0 Continue scheduled DuoNebs Solu-Medrol weaned to 40 twice daily, will change to via tube with taper Day #4 ceftriaxone length of therapy to be determined  Strep mitis bacteremia, Staph hominis.  Suspect contamination Plan Follow-up pending blood cultures drawn 9/12  Anoxic brain injury following cardiac arrest complicated by underlying Polysubstance abuse, prior cva complicated by post anoxic myoclonic seizures MRI confirming anoxic injury with cerebral edema, family aware Plan Continuing Keppra Depakote ordered for myoclonus Propofol infusion We will hold current level of sedation, organ procurement planned for tentatively 9/14  Hyperglycemia, prediabetes (A1c 6.4) Plan Continue sliding scale insulin goal 140-180  Transaminase elevation likely 2/2 shock liver hyperammonemia Plan Continue to trend LFTs  Mild leukocytosis Plan Continue to monitor   Best Practice (right click and "Reselect all SmartList Selections" daily)   Diet/type: NPO w/ meds via tube DVT prophylaxis: prophylactic heparin  GI prophylaxis: PPI Lines: N/A Foley:  Yes, and it is still needed Code Status:  full code Last date of multidisciplinary goals of care discussion [9/10- con't full code, discussing code status].  See IPAL note from 9/11. Called sister 9/12 but no answer, will attempt again later. Did update wife at bedside.  My cct 34 min  Simonne Martinet ACNP-BC [facility] Pulmonary/Critical Care Pager # [phone] OR # [phone] if no answer

## 2022-09-12 NOTE — Progress Notes (Signed)
eLink Physician-Brief Progress Note Patient Name: Darren Lane DOB: 1961-05-23 MRN: [id number]   Date of Service  09/12/2022  HPI/Events of Note  Received request to discontinue TTM protocol.   Pt is s/p PEA arrest.  GOC discussions made with family and they have decided comfort care and proceed with organ procurement.   eICU Interventions  TTM procotol discontinued.     Intervention Category Minor Interventions: Routine modifications to care plan (e.g. PRN medications for pain, fever)  Larinda Buttery 09/12/2022, 7:41 PM

## 2022-09-12 NOTE — Progress Notes (Signed)
PHARMACY - PHYSICIAN COMMUNICATION CRITICAL VALUE ALERT - BLOOD CULTURE IDENTIFICATION (BCID)  Darren Lane is an 61 y.o. male who presented to [facility] on 25-Sep-2022 with a chief complaint of s/p PEA arrest/anoxic brain injury.  Assessment:  9/12 bld cx 1/4 bottles growing GPC clusters - likely contaminant  Name of physician (or Provider) Contacted: Dr. Merrily Pew  Current antibiotics: Rocephin 2gm IV Q24  Changes to prescribed antibiotics recommended:  No additional abx needed  Christoper Fabian, PharmD, BCPS Please see amion for complete clinical pharmacist phone list 09/12/2022  6:56 PM

## 2022-09-13 ENCOUNTER — Encounter (HOSPITAL_COMMUNITY): Payer: Self-pay | Admitting: Certified Registered"

## 2022-09-13 ENCOUNTER — Encounter (HOSPITAL_COMMUNITY): Admission: EM | Disposition: E | Payer: Self-pay | Source: Home / Self Care | Attending: Internal Medicine

## 2022-09-13 HISTORY — PX: ORGAN PROCUREMENT: SHX5270

## 2022-09-13 LAB — CBC WITH DIFFERENTIAL/PLATELET
Abs Immature Granulocytes: 0.2 10*3/uL — ABNORMAL HIGH (ref 0.00–0.07)
Abs Immature Granulocytes: 0.2 10*3/uL — ABNORMAL HIGH (ref 0.00–0.07)
Abs Immature Granulocytes: 0.2 10*3/uL — ABNORMAL HIGH (ref 0.00–0.07)
Basophils Absolute: 0 10*3/uL (ref 0.0–0.1)
Basophils Absolute: 0 10*3/uL (ref 0.0–0.1)
Basophils Absolute: 0 10*3/uL (ref 0.0–0.1)
Basophils Relative: 0 %
Basophils Relative: 0 %
Basophils Relative: 0 %
Eosinophils Absolute: 0 10*3/uL (ref 0.0–0.5)
Eosinophils Absolute: 0.1 10*3/uL (ref 0.0–0.5)
Eosinophils Absolute: 0.1 10*3/uL (ref 0.0–0.5)
Eosinophils Relative: 0 %
Eosinophils Relative: 1 %
Eosinophils Relative: 1 %
HCT: 39.8 % (ref 39.0–52.0)
HCT: 39.3 % (ref 39.0–52.0)
HCT: 39.2 % (ref 39.0–52.0)
Hemoglobin: 13.1 g/dL (ref 13.0–17.0)
Hemoglobin: 13.2 g/dL (ref 13.0–17.0)
Hemoglobin: 13 g/dL (ref 13.0–17.0)
Immature Granulocytes: 1 %
Immature Granulocytes: 1 %
Immature Granulocytes: 2 %
Lymphocytes Relative: 7 %
Lymphocytes Relative: 8 %
Lymphocytes Relative: 8 %
Lymphs Abs: 1.1 10*3/uL (ref 0.7–4.0)
Lymphs Abs: 1.1 10*3/uL (ref 0.7–4.0)
Lymphs Abs: 1 10*3/uL (ref 0.7–4.0)
MCH: 29.9 pg (ref 26.0–34.0)
MCH: 30.4 pg (ref 26.0–34.0)
MCH: 30 pg (ref 26.0–34.0)
MCHC: 32.9 g/dL (ref 30.0–36.0)
MCHC: 33.6 g/dL (ref 30.0–36.0)
MCHC: 33.2 g/dL (ref 30.0–36.0)
MCV: 90.9 fL (ref 80.0–100.0)
MCV: 90.6 fL (ref 80.0–100.0)
MCV: 90.3 fL (ref 80.0–100.0)
Monocytes Absolute: 1.6 10*3/uL — ABNORMAL HIGH (ref 0.1–1.0)
Monocytes Absolute: 1.7 10*3/uL — ABNORMAL HIGH (ref 0.1–1.0)
Monocytes Absolute: 1.4 10*3/uL — ABNORMAL HIGH (ref 0.1–1.0)
Monocytes Relative: 11 %
Monocytes Relative: 12 %
Monocytes Relative: 11 %
Neutro Abs: 12.1 10*3/uL — ABNORMAL HIGH (ref 1.7–7.7)
Neutro Abs: 10.8 10*3/uL — ABNORMAL HIGH (ref 1.7–7.7)
Neutro Abs: 9.8 10*3/uL — ABNORMAL HIGH (ref 1.7–7.7)
Neutrophils Relative %: 81 %
Neutrophils Relative %: 78 %
Neutrophils Relative %: 78 %
Platelets: 207 10*3/uL (ref 150–400)
Platelets: 204 10*3/uL (ref 150–400)
Platelets: 200 10*3/uL (ref 150–400)
RBC: 4.38 MIL/uL (ref 4.22–5.81)
RBC: 4.34 MIL/uL (ref 4.22–5.81)
RBC: 4.34 MIL/uL (ref 4.22–5.81)
RDW: 13.3 % (ref 11.5–15.5)
RDW: 13.2 % (ref 11.5–15.5)
RDW: 13.2 % (ref 11.5–15.5)
WBC: 15 10*3/uL — ABNORMAL HIGH (ref 4.0–10.5)
WBC: 13.8 10*3/uL — ABNORMAL HIGH (ref 4.0–10.5)
WBC: 12.5 10*3/uL — ABNORMAL HIGH (ref 4.0–10.5)
nRBC: 0 % (ref 0.0–0.2)
nRBC: 0 % (ref 0.0–0.2)
nRBC: 0 % (ref 0.0–0.2)

## 2022-09-13 LAB — COMPREHENSIVE METABOLIC PANEL
ALT: 48 U/L — ABNORMAL HIGH (ref 0–44)
ALT: 47 U/L — ABNORMAL HIGH (ref 0–44)
ALT: 43 U/L (ref 0–44)
AST: 82 U/L — ABNORMAL HIGH (ref 15–41)
AST: 71 U/L — ABNORMAL HIGH (ref 15–41)
AST: 70 U/L — ABNORMAL HIGH (ref 15–41)
Albumin: 2.7 g/dL — ABNORMAL LOW (ref 3.5–5.0)
Albumin: 2.7 g/dL — ABNORMAL LOW (ref 3.5–5.0)
Albumin: 2.6 g/dL — ABNORMAL LOW (ref 3.5–5.0)
Alkaline Phosphatase: 37 U/L — ABNORMAL LOW (ref 38–126)
Alkaline Phosphatase: 37 U/L — ABNORMAL LOW (ref 38–126)
Alkaline Phosphatase: 34 U/L — ABNORMAL LOW (ref 38–126)
Anion gap: 8 (ref 5–15)
Anion gap: 11 (ref 5–15)
Anion gap: 9 (ref 5–15)
BUN: 27 mg/dL — ABNORMAL HIGH (ref 6–20)
BUN: 26 mg/dL — ABNORMAL HIGH (ref 6–20)
BUN: 22 mg/dL — ABNORMAL HIGH (ref 6–20)
CO2: 26 mmol/L (ref 22–32)
CO2: 27 mmol/L (ref 22–32)
CO2: 26 mmol/L (ref 22–32)
Calcium: 8.3 mg/dL — ABNORMAL LOW (ref 8.9–10.3)
Calcium: 8.3 mg/dL — ABNORMAL LOW (ref 8.9–10.3)
Calcium: 8.2 mg/dL — ABNORMAL LOW (ref 8.9–10.3)
Chloride: 108 mmol/L (ref 98–111)
Chloride: 107 mmol/L (ref 98–111)
Chloride: 106 mmol/L (ref 98–111)
Creatinine, Ser: 0.58 mg/dL — ABNORMAL LOW (ref 0.61–1.24)
Creatinine, Ser: 0.57 mg/dL — ABNORMAL LOW (ref 0.61–1.24)
Creatinine, Ser: 0.62 mg/dL (ref 0.61–1.24)
GFR, Estimated: >60 mL/min (ref >60)
GFR, Estimated: >60 mL/min (ref >60)
GFR, Estimated: >60 mL/min (ref >60)
Glucose, Bld: 112 mg/dL — ABNORMAL HIGH (ref 70–99)
Glucose, Bld: 121 mg/dL — ABNORMAL HIGH (ref 70–99)
Glucose, Bld: 130 mg/dL — ABNORMAL HIGH (ref 70–99)
Potassium: 4.3 mmol/L (ref 3.5–5.1)
Potassium: 4.4 mmol/L (ref 3.5–5.1)
Potassium: 4.2 mmol/L (ref 3.5–5.1)
Sodium: 142 mmol/L (ref 135–145)
Sodium: 145 mmol/L (ref 135–145)
Sodium: 141 mmol/L (ref 135–145)
Total Bilirubin: 0.6 mg/dL (ref 0.3–1.2)
Total Bilirubin: 0.3 mg/dL (ref 0.3–1.2)
Total Bilirubin: 0.4 mg/dL (ref 0.3–1.2)
Total Protein: 5.6 g/dL — ABNORMAL LOW (ref 6.5–8.1)
Total Protein: 5.5 g/dL — ABNORMAL LOW (ref 6.5–8.1)
Total Protein: 5.6 g/dL — ABNORMAL LOW (ref 6.5–8.1)

## 2022-09-13 LAB — POCT I-STAT 7, (LYTES, BLD GAS, ICA,H+H)
Acid-Base Excess: 4 mmol/L — ABNORMAL HIGH (ref 0.0–2.0)
Bicarbonate: 29.5 mmol/L — ABNORMAL HIGH (ref 20.0–28.0)
Calcium, Ion: 1.19 mmol/L (ref 1.15–1.40)
HCT: 37 % — ABNORMAL LOW (ref 39.0–52.0)
Hemoglobin: 12.6 g/dL — ABNORMAL LOW (ref 13.0–17.0)
O2 Saturation: 99 %
Patient temperature: 36.7
Potassium: 4.3 mmol/L (ref 3.5–5.1)
Sodium: 142 mmol/L (ref 135–145)
TCO2: 31 mmol/L (ref 22–32)
pCO2 arterial: 44.3 mmHg (ref 32–48)
pH, Arterial: 7.43 (ref 7.35–7.45)
pO2, Arterial: 120 mmHg — ABNORMAL HIGH (ref 83–108)

## 2022-09-13 LAB — CK TOTAL AND CKMB (NOT AT ARMC)
CK, MB: 5.1 ng/mL — ABNORMAL HIGH (ref 0.5–5.0)
CK, MB: 4.3 ng/mL (ref 0.5–5.0)
CK, MB: 3.5 ng/mL (ref 0.5–5.0)
Relative Index: 1.7 (ref 0.0–2.5)
Relative Index: 1.8 (ref 0.0–2.5)
Relative Index: 1.9 (ref 0.0–2.5)
Total CK: 294 U/L (ref 49–397)
Total CK: 233 U/L (ref 49–397)
Total CK: 181 U/L (ref 49–397)

## 2022-09-13 LAB — LACTATE DEHYDROGENASE
LDH: 455 U/L — ABNORMAL HIGH (ref 98–192)
LDH: 448 U/L — ABNORMAL HIGH (ref 98–192)
LDH: 424 U/L — ABNORMAL HIGH (ref 98–192)

## 2022-09-13 LAB — CULTURE, RESPIRATORY W GRAM STAIN
Culture: NORMAL
Gram Stain: NONE SEEN

## 2022-09-13 LAB — PHOSPHORUS
Phosphorus: 3.5 mg/dL (ref 2.5–4.6)
Phosphorus: 3.8 mg/dL (ref 2.5–4.6)
Phosphorus: 3.7 mg/dL (ref 2.5–4.6)

## 2022-09-13 LAB — MAGNESIUM
Magnesium: 2.1 mg/dL (ref 1.7–2.4)
Magnesium: 2.1 mg/dL (ref 1.7–2.4)
Magnesium: 2 mg/dL (ref 1.7–2.4)

## 2022-09-13 LAB — TROPONIN I (HIGH SENSITIVITY)
Troponin I (High Sensitivity): 22 ng/L — ABNORMAL HIGH (ref <18)
Troponin I (High Sensitivity): 16 ng/L (ref <18)
Troponin I (High Sensitivity): 37 ng/L — ABNORMAL HIGH (ref <18)

## 2022-09-13 LAB — GLUCOSE, CAPILLARY
Glucose-Capillary: 120 mg/dL — ABNORMAL HIGH (ref 70–99)
Glucose-Capillary: 111 mg/dL — ABNORMAL HIGH (ref 70–99)
Glucose-Capillary: 134 mg/dL — ABNORMAL HIGH (ref 70–99)

## 2022-09-13 LAB — BILIRUBIN, DIRECT
Bilirubin, Direct: <0.1 mg/dL (ref 0.0–0.2)
Bilirubin, Direct: <0.1 mg/dL (ref 0.0–0.2)
Bilirubin, Direct: <0.1 mg/dL (ref 0.0–0.2)

## 2022-09-13 LAB — PROTIME-INR
INR: 0.9 (ref 0.8–1.2)
INR: 1 (ref 0.8–1.2)
INR: 1 (ref 0.8–1.2)
Prothrombin Time: 12.5 seconds (ref 11.4–15.2)
Prothrombin Time: 13.3 seconds (ref 11.4–15.2)
Prothrombin Time: 13 seconds (ref 11.4–15.2)

## 2022-09-13 LAB — APTT
aPTT: 27 seconds (ref 24–36)
aPTT: 28 seconds (ref 24–36)
aPTT: 28 seconds (ref 24–36)

## 2022-09-13 LAB — TRIGLYCERIDES: Triglycerides: 339 mg/dL — ABNORMAL HIGH (ref <150)

## 2022-09-13 SURGERY — SURGICAL PROCUREMENT, ORGAN
Anesthesia: Choice

## 2022-09-13 MED ORDER — HALOPERIDOL LACTATE 5 MG/ML IJ SOLN
2.5000 mg | INTRAMUSCULAR | Status: DC | PRN
  Administered 2022-09-14: 5 mg via INTRAVENOUS
  Filled 2022-09-13 (×2): qty 1

## 2022-09-13 MED ORDER — ACETAMINOPHEN 160 MG/5ML PO SOLN
650.0000 mg | Freq: Four times a day (QID) | ORAL | Status: DC | PRN
  Administered 2022-09-13: 650 mg
  Filled 2022-09-13: qty 20.3

## 2022-09-13 MED ORDER — POLYVINYL ALCOHOL 1.4 % OP SOLN: 1.0000 [drp] | Freq: Four times a day (QID) | OPHTHALMIC | Status: DC | PRN

## 2022-09-13 MED ORDER — MIDAZOLAM HCL 2 MG/2ML IJ SOLN
2.0000 mg | INTRAMUSCULAR | Status: DC | PRN
  Administered 2022-09-13: 2 mg via INTRAVENOUS
  Administered 2022-09-14 (×2): 4 mg via INTRAVENOUS
  Filled 2022-09-13 (×2): qty 4

## 2022-09-13 MED ORDER — GLYCOPYRROLATE 0.2 MG/ML IJ SOLN
0.2000 mg | INTRAMUSCULAR | Status: DC | PRN
  Administered 2022-09-13 – 2022-09-14 (×4): 0.2 mg via INTRAVENOUS
  Filled 2022-09-13 (×5): qty 1

## 2022-09-13 MED ORDER — GLYCOPYRROLATE 0.2 MG/ML IJ SOLN: 0.2000 mg | INTRAMUSCULAR | Status: DC | PRN

## 2022-09-13 MED ORDER — MIDAZOLAM-SODIUM CHLORIDE 100-0.9 MG/100ML-% IV SOLN
2.0000 mg/h | INTRAVENOUS | Status: DC
  Administered 2022-09-13: 2 mg/h via INTRAVENOUS
  Administered 2022-09-14 (×3): 25 mg/h via INTRAVENOUS
  Filled 2022-09-13 (×5): qty 100

## 2022-09-13 MED ORDER — ACETAMINOPHEN 325 MG PO TABS: 650.0000 mg | ORAL_TABLET | Freq: Four times a day (QID) | ORAL | Status: DC | PRN

## 2022-09-13 MED ORDER — HEPARIN SODIUM 10000 UNIT/ML FOR ORGAN DONATION
30000.0000 [IU] | INTRAMUSCULAR | Status: DC
  Filled 2022-09-13: qty 3

## 2022-09-13 MED ORDER — GLYCOPYRROLATE 1 MG PO TABS: 1.0000 mg | ORAL_TABLET | ORAL | Status: DC | PRN

## 2022-09-13 MED ORDER — SODIUM CHLORIDE 0.9 % IV SOLN: INTRAVENOUS | Status: DC

## 2022-09-13 MED ORDER — MORPHINE BOLUS VIA INFUSION
5.0000 mg | INTRAVENOUS | Status: DC | PRN
  Administered 2022-09-13 – 2022-09-14 (×21): 5 mg via INTRAVENOUS

## 2022-09-13 MED ORDER — MORPHINE 100MG IN NS 100ML (1MG/ML) PREMIX INFUSION
0.0000 mg/h | INTRAVENOUS | Status: DC
  Administered 2022-09-13: 5 mg/h via INTRAVENOUS
  Administered 2022-09-13 – 2022-09-14 (×5): 20 mg/h via INTRAVENOUS
  Filled 2022-09-13 (×6): qty 100

## 2022-09-13 MED ORDER — ACETAMINOPHEN 650 MG RE SUPP: 650.0000 mg | Freq: Four times a day (QID) | RECTAL | Status: DC | PRN

## 2022-09-13 SURGICAL SUPPLY — 88 items
APPLIER CLIP 11 MED OPEN (CLIP) ×1
APR CLP MED 11 20 MLT OPN (CLIP) ×1
BAG COUNTER SPONGE SURGICOUNT (BAG) ×1 IMPLANT
BAG SPNG CNTER NS LX DISP (BAG) ×1
BLADE CLIPPER SURG (BLADE) IMPLANT
BLADE SAW STERNAL (BLADE) ×1 IMPLANT
BLADE STERNUM SYSTEM 6 (BLADE) IMPLANT
BLADE SURG 10 STRL SS (BLADE) IMPLANT
CLIP APPLIE 11 MED OPEN (CLIP) ×1 IMPLANT
CLIP VESOCCLUDE MED 24/CT (CLIP) IMPLANT
CLIP VESOCCLUDE SM WIDE 24/CT (CLIP) IMPLANT
CNTNR URN SCR LID CUP LEK RST (MISCELLANEOUS) ×1 IMPLANT
CONT SPEC 4OZ STRL OR WHT (MISCELLANEOUS) ×1
COVER BACK TABLE 60X90IN (DRAPES) IMPLANT
COVER MAYO STAND STRL (DRAPES) IMPLANT
COVER SURGICAL LIGHT HANDLE (MISCELLANEOUS) ×1 IMPLANT
DRAPE HALF SHEET 40X57 (DRAPES) IMPLANT
DRAPE SLUSH MACHINE 52X66 (DRAPES) ×1 IMPLANT
DRAPE UNIVERSAL PACK (DRAPES) IMPLANT
DRSG COVADERM 4X10 (GAUZE/BANDAGES/DRESSINGS) IMPLANT
DRSG TELFA 3X8 NADH (GAUZE/BANDAGES/DRESSINGS) ×1 IMPLANT
DURAPREP 26ML APPLICATOR (WOUND CARE) IMPLANT
ELECT BLADE 6.5 EXT (BLADE) IMPLANT
ELECT REM PT RETURN 9FT ADLT (ELECTROSURGICAL) ×2
ELECTRODE REM PT RTRN 9FT ADLT (ELECTROSURGICAL) ×2 IMPLANT
GAUZE 4X4 16PLY ~~LOC~~+RFID DBL (SPONGE) IMPLANT
GLOVE BIO SURGEON STRL SZ7 (GLOVE) IMPLANT
GLOVE BIO SURGEON STRL SZ7.5 (GLOVE) IMPLANT
GLOVE BIO SURGEON STRL SZ8 (GLOVE) IMPLANT
GLOVE BIO SURGEON STRL SZ8.5 (GLOVE) IMPLANT
GLOVE BIOGEL PI IND STRL 7.0 (GLOVE) IMPLANT
GLOVE BIOGEL PI IND STRL 7.5 (GLOVE) IMPLANT
GLOVE BIOGEL PI IND STRL 8 (GLOVE) IMPLANT
GLOVE BIOGEL PI IND STRL 8.5 (GLOVE) IMPLANT
GLOVE SURG SS PI 7.0 STRL IVOR (GLOVE) IMPLANT
GLOVE SURG SS PI 7.5 STRL IVOR (GLOVE) IMPLANT
GLOVE SURG SS PI 8.0 STRL IVOR (GLOVE) IMPLANT
GOWN STRL REUS W/ TWL LRG LVL3 (GOWN DISPOSABLE) ×4 IMPLANT
GOWN STRL REUS W/ TWL XL LVL3 (GOWN DISPOSABLE) ×2 IMPLANT
GOWN STRL REUS W/TWL LRG LVL3 (GOWN DISPOSABLE) ×4
GOWN STRL REUS W/TWL XL LVL3 (GOWN DISPOSABLE) ×2
HANDLE SUCTION POOLE (INSTRUMENTS) IMPLANT
KIT POST MORTEM ADULT 36X90 (BAG) ×1 IMPLANT
KIT TURNOVER KIT B (KITS) ×1 IMPLANT
LOOP VESSEL MAXI 1X406 BLUE (MISCELLANEOUS)
LOOP VESSEL MAXI BLUE (MISCELLANEOUS) IMPLANT
LOOP VESSEL MINI RED (MISCELLANEOUS) IMPLANT
MANIFOLD NEPTUNE II (INSTRUMENTS) ×1 IMPLANT
NDL BIOPSY 14X6 SOFT TISS (NEEDLE) IMPLANT
NEEDLE BIOPSY 14X6 SOFT TISS (NEEDLE) ×1 IMPLANT
NS IRRIG 1000ML POUR BTL (IV SOLUTION) IMPLANT
PACK AORTA (CUSTOM PROCEDURE TRAY) ×1 IMPLANT
PAD ARMBOARD 7.5X6 YLW CONV (MISCELLANEOUS) ×2 IMPLANT
PAD DRESSING TELFA 3X8 NADH (GAUZE/BANDAGES/DRESSINGS) ×1 IMPLANT
PENCIL BUTTON HOLSTER BLD 10FT (ELECTRODE) ×1 IMPLANT
SOL PREP POV-IOD 4OZ 10% (MISCELLANEOUS) ×2 IMPLANT
SPONGE INTESTINAL PEANUT (DISPOSABLE) IMPLANT
SPONGE T-LAP 18X18 ~~LOC~~+RFID (SPONGE) IMPLANT
STAPLER VISISTAT 35W (STAPLE) ×1 IMPLANT
SUCTION POOLE HANDLE (INSTRUMENTS)
SUT BONE WAX W31G (SUTURE) IMPLANT
SUT ETHIBOND 5 LR DA (SUTURE) IMPLANT
SUT ETHILON 1 LR 30 (SUTURE) ×2 IMPLANT
SUT ETHILON 2 LR (SUTURE) IMPLANT
SUT PROLENE 3 0 RB 1 (SUTURE) IMPLANT
SUT PROLENE 3 0 SH 1 (SUTURE) IMPLANT
SUT PROLENE 4 0 RB 1 (SUTURE)
SUT PROLENE 4-0 RB1 .5 CRCL 36 (SUTURE) IMPLANT
SUT PROLENE 5 0 C 1 24 (SUTURE) IMPLANT
SUT PROLENE 6 0 BV (SUTURE) IMPLANT
SUT SILK 0 TIES 10X30 (SUTURE) IMPLANT
SUT SILK 1 SH (SUTURE) IMPLANT
SUT SILK 1 TIES 10X30 (SUTURE) IMPLANT
SUT SILK 2 0 (SUTURE)
SUT SILK 2 0 SH (SUTURE) IMPLANT
SUT SILK 2 0 SH CR/8 (SUTURE) IMPLANT
SUT SILK 2 0 TIES 10X30 (SUTURE) IMPLANT
SUT SILK 2-0 18XBRD TIE 12 (SUTURE) IMPLANT
SUT SILK 3 0 SH CR/8 (SUTURE) IMPLANT
SUT SILK 3 0 TIES 10X30 (SUTURE) IMPLANT
SWAB COLLECTION DEVICE MRSA (MISCELLANEOUS) IMPLANT
SWAB CULTURE ESWAB REG 1ML (MISCELLANEOUS) IMPLANT
SYR 50ML LL SCALE MARK (SYRINGE) IMPLANT
SYRINGE TOOMEY DISP (SYRINGE) IMPLANT
TAPE UMBILICAL 1/8 X36 TWILL (MISCELLANEOUS) IMPLANT
TUBE CONNECTING 12X1/4 (SUCTIONS) ×1 IMPLANT
WATER STERILE IRR 1000ML POUR (IV SOLUTION) IMPLANT
YANKAUER SUCT BULB TIP NO VENT (SUCTIONS) ×1 IMPLANT

## 2022-09-13 NOTE — Progress Notes (Signed)
   10/01/2022 1600  Clinical Encounter Type  Visited With Family;Health care provider  Visit Type Initial;Spiritual support  Referral From Nurse  Consult/Referral To Chaplain   Chaplain responded to a call for support of the family. The patient, Darren Lane, Is donating his organs.  Family shared several things they hold in their memories about Lucia. 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  We prayed a short pray of support for the family. The family was caring for one another and quietly grieving so I departed allowing them to be with [name] in these last hours of his life.    Valerie Roys [facility]  [phone]

## 2022-09-13 NOTE — Progress Notes (Signed)
eLink Physician-Brief Progress Note Patient Name: Darren Lane DOB: 1961-11-27 MRN: [id number]   Date of Service  09/11/2022  HPI/Events of Note  Patient is comfort measures. Now has Fentanyl, Propofol, Morphine and Versed IV infusions ordered for sedation/comfort.   eICU Interventions  Plan: Continue Morphine and Versed IV infusion.  D/C Propofol and Fentanyl IV infusions.     Intervention Category Major Interventions: Other:  [name] 09/18/2022, 10:29 PM

## 2022-09-13 NOTE — OR Nursing (Signed)
Disconnected from Respiratory: 1850 Spouse in room

## 2022-09-13 NOTE — Progress Notes (Signed)
NAME:  Darren Lane, MRN:  [id number], DOB:  [birth year], LOS: 4 ADMISSION DATE:  09/18/2022, CONSULTATION DATE:  9/10 REFERRING MD:  Dr. Nicanor Alcon, CHIEF COMPLAINT:  post arrest   History of Present Illness:  Patient is a 61 year old male with pertinent PMH of asthma and polysubstance abuse presents to [facility] ED on 9/10 post arrest.  Found down 9/10 by a girlfriend unresponsive and pulseless. Reports that he was using fentanyl earlier. Unwitnessed arrest. EMS called and found patient pulseless and asystole. Powder appreciated on floor on scene.  CPR started and [facility] airway placed.  Epi given.  ROSC about 20 minutes.  Patient transferred to [facility] ED.  Upon arrival to [facility] ED on 9/10 patient with [facility] airway.  UDS was obtained prior to giving RSI medications for ETT exchange.  Patient initial BP 189/108, HR 107.  Breathing spontaneously but diminished wheezing present.  GCS 1.  CXR appears clear with ETT in good position.  CT head with no acute abnormality.  EKG with no elevated ST.  LA 7, K5.2, glucose 229, AST 148, ALT 121.  Salicylate, acetaminophen, ethanol WNL.  UDS positive for cocaine and opiates.  WBC 14.1.  Troponin 31.  ABG 7.10, 81, 510, 26.  PCCM consulted for ICU admission.  Pertinent  Medical History  polysubstance abuse Asthma Retinal detachment of right eye  Significant Hospital Events: Including procedures, antibiotic start and stop dates in addition to other pertinent events   9/10: post arrest; unknown down time; found with fentanyl and cocaine in UDS; EMS called found pulseless in asystole; king airway placed and performed CPR for about 20 minutes until rosc. Admitted to [facility]. 9/11 EEG with myoclonic seizures and profound encephalopathy suggestive of anoxic injury. 9/11 Versed weaned off but Propofol remains at 60, when weaned myoclonic and decerebrate posturing noted. 9/12 MRI with diffuse restricted diffusion involving the cortex and bilateral basal ganglia as well as the thalami  raising concern for hypoxic/ischemic injury there was mild cerebral edema without mass effect.  CT chest showing patchy bibasilar airspace disease.  Mild hepatic steatosis with focal fatty sparing.  EEG suggesting profound/diffuse encephalopathy but no seizure.  Goals of care discussion completed Family decided on DNR  Interim History / Subjective:  [place] is on the case, patient is slotted to go to the OR for organ procurement around 5 PM No overnight issues LFTs remain elevated  Objective   Blood pressure 133/64, pulse 66, temperature 98.4 F (36.9 C), resp. rate (!) 24, height 5\' 9"  (1.753 m), weight 80.4 kg, SpO2 97 %.    Vent Mode: PRVC FiO2 (%):  [40 %] 40 % Set Rate:  [24 bmp] 24 bmp Vt Set:  [560 mL] 560 mL PEEP:  [5 cmH20] 5 cmH20 Plateau Pressure:  [18 cmH20-22 cmH20] 19 cmH20   Intake/Output Summary (Last 24 hours) at 2022/09/19 1035 Last data filed at 09-19-22 1000 Gross per 24 hour  Intake 2477.83 ml  Output 1995 ml  Net 482.83 ml   Filed Weights   09/12/22 0000 09/12/22 0428 09-19-22 0435  Weight: 79.4 kg 79.4 kg 80.4 kg    Examination:   Physical exam: General: Crtitically ill-appearing male, orally intubated HEENT: /AT, eyes anicteric.  ETT and OGT in place Neuro: Sedated, not following commands.  Eyes are closed.  Pupils 3 mm bilateral reactive to light.  Absent pupillary, gag, cough reflexes Chest: Coarse breath sounds, no wheezes or rhonchi Heart: Regular rate and rhythm, no murmurs or gallops Abdomen: Soft, nontender, nondistended,  bowel sounds present Skin: No rash   Assessment & Plan:  Principal Problem:   Cardiac arrest [facility]) Active Problems:   Aspiration pneumonia (HCC)   History of CVA (cerebrovascular accident)   Acute respiratory failure with hypoxia (HCC)   Transaminitis   Hyperglycemia   Overdose of undetermined intent   Anoxic encephalopathy (HCC)   Myoclonic jerking   Leukocytosis   DNR (do not resuscitate)   Status post  cardiac Arrest, PEA secondary to drug overdose Lactic acidosis Continue supportive care [place] is on the case, plan for organ procurement at 5 PM  Acute respiratory failure with hypercarbia and hypoxia on MV Asthma exacerbation and possible aspiration pneumonia Continue full ventilator support VAP bundle PAD protocol RASS goal 0, currently on propofol and fentanyl Discontinue prednisone Continue day #5 ceftriaxone   Strep mitis bacteremia, Staph hominis.  Likely contamination Remained afebrile  Anoxic brain injury/anoxic encephalopathy Polysubstance abuse, Status myoclonus MRI confirming anoxic injury with cerebral edema, family aware Continuing Keppra Propofol infusion  Prediabetes with hyperglycemia in setting of steroid Continue sliding scale insulin goal 140-180  Shock liver Monitor LFTs  Hypertriglyceridemia in the setting of propofol infusion Closely monitor  Best Practice (right click and "Reselect all SmartList Selections" daily)   Diet/type: NPO DVT prophylaxis: prophylactic heparin  GI prophylaxis: PPI Lines: N/A Foley:  Yes, and it is still needed Code Status:  DNR   Total critical care time: 32 minutes  Performed by: Cheri Fowler   Critical care time was exclusive of separately billable procedures and treating other patients.   Critical care was necessary to treat or prevent imminent or life-threatening deterioration.   Critical care was time spent personally by me on the following activities: development of treatment plan with patient and/or surrogate as well as nursing, discussions with consultants, evaluation of patient's response to treatment, examination of patient, obtaining history from patient or surrogate, ordering and performing treatments and interventions, ordering and review of laboratory studies, ordering and review of radiographic studies, pulse oximetry and re-evaluation of patient's condition.   Cheri Fowler, MD [facility]  Critical Care See Amion for pager If no response to pager, please call [phone] until 7pm After 7pm, Please call E-link [phone]

## 2022-09-13 NOTE — Progress Notes (Signed)
RT NOTE: RT transported patient on ventilator to OR. Per donor services patient taken off ventilator, ETT remained per donor services as patient is [place] case. RT appreciates the opportunity to care for Darren Lane.

## 2022-09-13 NOTE — Progress Notes (Signed)
Late entry: Patient transported to OR with donor services. Patient extubated at 1850. PRN medications administered per orders, see MAR. Report given to Anthea, RN in [place] at 1945.

## 2022-09-14 ENCOUNTER — Encounter (HOSPITAL_COMMUNITY): Payer: Self-pay

## 2022-09-14 LAB — CULTURE, BLOOD (ROUTINE X 2)
Culture: NO GROWTH
Special Requests: ADEQUATE

## 2022-09-14 MED FILL — Sodium Chloride IV Soln 0.9%: INTRAVENOUS | Qty: 250 | Status: AC

## 2022-09-14 MED FILL — Fentanyl Citrate Preservative Free (PF) Inj 2500 MCG/50ML: INTRAMUSCULAR | Qty: 2500 | Status: AC

## 2022-09-15 LAB — CULTURE, BLOOD (ROUTINE X 2)
Culture: NO GROWTH
Culture: NO GROWTH
Special Requests: ADEQUATE

## 2022-09-16 LAB — CULTURE, BLOOD (ROUTINE X 2): Culture: NO GROWTH

## 2022-09-30 NOTE — Death Summary Note (Signed)
DEATH SUMMARY   Patient Details  Name: Darren Lane MRN: [id number] DOB: 05/10/1961  Admission/Discharge Information   Admit Date:  09/10/22  Date of Death: Date of Death: Sep 15, 2022  Time of Death: Time of Death: 1436  Length of Stay: 5  Referring Physician: Patient, No Pcp Per   Reason(s) for Hospitalization  S/p PEA cardiac arrest in setting of drug overdose Severe anoxic brain injury Anoxic encephalopathy Acute lactic acidosis Acute hypoxic/hypercapnic respiratory failure Probable aspiration pneumonia Acute asthma exacerbation Polysubstance abuse Status myoclonus Prediabetes with hyperglycemia due to steroid Shock liver Hypertriglyceridemia  Diagnoses  Preliminary cause of death:   Withdrawal of care in the setting of severe anoxic brain injury from cardiac arrest Secondary Diagnoses (including complications and co-morbidities):  Principal Problem:   Cardiac arrest [facility]) Active Problems:   Aspiration pneumonia (HCC)   History of CVA (cerebrovascular accident)   Acute respiratory failure with hypoxia (HCC)   Transaminitis   Hyperglycemia   Overdose of undetermined intent   Anoxic encephalopathy (HCC)   Myoclonic jerking   Leukocytosis   DNR (do not resuscitate)   Brief Hospital Course (including significant findings, care, treatment, and services provided and events leading to death)  [name] is a 61 y.o. year old male who with pertinent PMH of asthma and polysubstance abuse presents to [facility] ED on 09-11-2023 post arrest.   Found down 9/10 by a girlfriend unresponsive and pulseless. Reports that he was using fentanyl earlier. Unwitnessed arrest. EMS called and found patient pulseless and asystole. Powder appreciated on floor on scene.  CPR started and [facility] airway placed.  Epi given.  ROSC about 20 minutes.  Patient transferred to [facility] ED.   Upon arrival to [facility] ED on 11-Sep-2023 patient with [facility] airway.  UDS was obtained prior to giving RSI medications for ETT exchange.   Patient initial BP 189/108, HR 107.  Breathing spontaneously but diminished wheezing present.  GCS 1.  CXR appears clear with ETT in good position.  CT head with no acute abnormality.  EKG with no elevated ST.  LA 7, K5.2, glucose 229, AST 148, ALT 121.  Salicylate, acetaminophen, ethanol WNL.  UDS positive for cocaine and opiates.  WBC 14.1.  Troponin 31.  ABG 7.10, 81, 510, 26.  PCCM consulted for ICU admission.  Patient was admitted to ICU, he was started on IV antibiotic therapy, normothermia protocol was continued.  Patient started with a status myoclonus, he was continued on propofol, continuous EEG was done after propofol initiation status myoclonus stopped.  MRI brain was done which confirmed severe anoxic brain injury, goals of care discussions were carried with patient's family including sister and patient girlfriend, they decided to proceed with comfort care and withdrawal of care.  Patient passed on 2022/09/15.    Pertinent Labs and Studies  Significant Diagnostic Studies CT CHEST ABDOMEN PELVIS WO CONTRAST  Result Date: 09/11/2022 CLINICAL DATA:  Organ donor workup EXAM: CT CHEST, ABDOMEN AND PELVIS WITHOUT CONTRAST TECHNIQUE: Multidetector CT imaging of the chest, abdomen and pelvis was performed following the standard protocol without IV contrast. RADIATION DOSE REDUCTION: This exam was performed according to the departmental dose-optimization program which includes automated exposure control, adjustment of the mA and/or kV according to patient size and/or use of iterative reconstruction technique. COMPARISON:  None Available. FINDINGS: Motion degraded images. CT CHEST FINDINGS Cardiovascular: Heart is normal in size.  No pericardial effusion. No evidence of thoracic aortic aneurysm. Mediastinum/Nodes: No suspicious mediastinal lymphadenopathy. Visualized thyroid is within normal limits.  Lungs/Pleura: Endotracheal tube is 5 cm above the carina. Evaluation lung parenchyma is constrained by  respiratory motion. Within that constraint, there are no suspicious pulmonary nodules. Moderate centrilobular and paraseptal emphysematous changes, upper lung predominant. Mild patchy bilateral lower lobe opacities, favoring atelectasis. No focal consolidation or aspiration. No pleural effusion or pneumothorax. Musculoskeletal: Mild degenerative changes of the mid/lower thoracic spine. No fracture is seen. CT ABDOMEN PELVIS FINDINGS Hepatobiliary: Liver is notable for mild hepatic steatosis with areas of focal fatty sparing. Gallbladder is unremarkable. No intrahepatic or extrahepatic ductal dilatation. Pancreas: Within normal limits. Spleen: Within normal limits Adrenals/Urinary Tract: Adrenal glands are within normal limits. Kidneys are within normal limits. No renal, ureteral, or bladder calculi. No hydronephrosis. Bladder is decompressed by an indwelling Foley catheter. Mild layering high density in the bladder (series 3/image 126) may reflect hemorrhage or contrast (if a procedure was performed). Stomach/Bowel: Enteric tube coursing to the stomach and terminating in the proximal duodenum. Stomach is above is within normal limits. No evidence of bowel obstruction. Normal appendix (series 3/image 113). Scattered mild right colonic diverticulosis, without evidence of diverticulitis. Vascular/Lymphatic: No evidence of abdominal aortic aneurysm. Atherosclerotic calcifications of the abdominal aorta and branch vessels. No suspicious abdominopelvic lymphadenopathy. Reproductive: Prostate is unremarkable. Other: No abdominopelvic ascites. Musculoskeletal: Visualized osseous structures are within normal limits. IMPRESSION: Motion degraded images. Mild patchy bilateral lower lobe opacities, favoring atelectasis. Endotracheal tube is 5 cm above the carina. Enteric tube coursing to the stomach and terminating in the proximal duodenum. Mild hepatic steatosis with areas of focal fatty sparing. Bladder is decompressed by an  indwelling Foley catheter. Mild layering high density in the bladder may reflect hemorrhage or contrast (if a procedure was performed). Electronically Signed   By: Julian Hy M.D.   On: 09/11/2022 21:58   MR BRAIN WO CONTRAST  Result Date: 09/11/2022 CLINICAL DATA:  Neuro deficit, acute, stroke suspected Tech note: Dizziness 61 year old man with a history of polysubstance abuse including fentanyl. He was found down 9/10 unresponsive, pulseless by his girlfriend. They had been using narcotics. EXAM: MRI HEAD WITHOUT CONTRAST TECHNIQUE: Multiplanar, multiecho pulse sequences of the brain and surrounding structures were obtained without intravenous contrast. COMPARISON:  CT head 09/20/2022. FINDINGS: Brain: Diffuse restricted diffusion involving the cortex, bilateral basal ganglia and thalami, which is concerning for hypoxic/ischemic injury. Mild associated edema without significant mass effect. No midline shift, acute hemorrhage, mass lesion or hydrocephalus. Vascular: Major arterial flow voids are maintained skull base. Skull and upper cervical spine: Normal marrow signal. Sinuses/Orbits: Moderate to severe paranasal sinus mucosal thickening and air-fluid levels. No acute orbital findings. Other: No mastoid effusions. IMPRESSION: Diffuse restricted diffusion involving the cortex, bilateral basal ganglia and thalami, which is concerning for hypoxic/ischemic injury (particularly given the provided clinical history). Mild edema without mass effect. These results will be called to the ordering clinician or representative by the Radiologist Assistant, and communication documented in the PACS or [employer]. Electronically Signed   By: Margaretha Sheffield M.D.   On: 09/11/2022 15:51   Overnight EEG with video  Result Date: 09/10/2022 Lora Havens, MD     09/10/2022  8:47 AM Patient Name: KORDALE OBA MRN: [id number] Epilepsy Attending: Lora Havens Referring Physician/Provider: Julian Hy, DO  Duration: 09/02/2022 0825 to 09/10/2022 0825  Patient history: 61yo M s/p cardiac arrest. EEG to evaluate for seizure  Level of alertness: comatose  AEDs during EEG study: propofol, versed, LEV  Technical aspects: This EEG study was done with scalp  electrodes positioned according to the 10-20 International system of electrode placement. Electrical activity was reviewed with band pass filter of 1-70Hz , sensitivity of 7 uV/mm, display speed of 45mm/sec with a 60Hz  notched filter applied as appropriate. EEG data were recorded continuously and digitally stored.  Video monitoring was available and reviewed as appropriate.  Description: Patient was noted to have episodes of sudden eye opening with whole body jerking every 10-20 seconds lasting 5-15 seconds. Concomitant EEG showed generalized polyspikes consistent with myoclonic seizures.  In between seizures EEG showed generalized background suppression.  As sedation was adjusted, clinical jerking/seizures stopped.  EEG then showed burst suppression pattern with highly epileptiform bursts lasting 10-15 seconds alternating with EEG suppression lasting 5 to 15 seconds.  After around 1800, the duration of bursts became smaller and rare 1 second bursts were noted with near continuous background suppression which was not reactive to stimulation.  Gradually after around 2 AM, EEG showed burst suppression with bursts of polymorphic 3 to 5 Hz  theta-delta slowing lasting 1 to 2 seconds alternating with 3 to 5 seconds of generalized EEG suppression hyperventilation and photic stimulation were not performed.    ABNORMALITY - Myoclonic seizures, generalized - Burst suppression, generalized  IMPRESSION: At the beginning of the study, patient was noted to have myoclonic seizures every  10-20 seconds lasting 5-15 seconds as described above.  As sedation was adjusted, myoclonic seizures abated.  EEG was then suggestive of epileptogenicity with generalized onset and high risk of seizure  recurrence as well as profound diffuse encephalopathy.  In the setting of cardiac arrest, this EEG pattern is suggestive of anoxic/hypoxic brain injury.  Lora Havens    ECHOCARDIOGRAM COMPLETE  Result Date: 09/12/2022    ECHOCARDIOGRAM REPORT   Patient Name:   ELIAHS SPRUIELL Date of Exam: 09/02/2022 Medical Rec #:  [id number]    Height:       69.0 in Accession #:    [id number]   Weight:       194.7 lb Date of Birth:  08-Apr-1961    BSA:          2.042 m Patient Age:    73 years     BP:           126/82 mmHg Patient Gender: M            HR:           80 bpm. Exam Location:  Inpatient Procedure: 2D Echo, Color Doppler, Cardiac Doppler and Intracardiac            Opacification Agent Indications:    Cardiac arrest  History:        Patient has no prior history of Echocardiogram examinations.  Sonographer:    Memory Argue Referring Phys: [id number] [place]  1. Very limited study due to very poor echo windows.  2. Left ventricular ejection fraction, by estimation, is 55 to 60%. The left ventricle has normal function. Left ventricular endocardial border not optimally defined to evaluate regional wall motion. Left ventricular diastolic parameters are consistent with Grade I diastolic dysfunction (impaired relaxation).  3. The right ventricule is not well visualized but grossly the systolic function appears normal. S' 15. Right ventricular systolic function was not well visualized. The right ventricular size is not well visualized.  4. The mitral valve is grossly normal. No evidence of mitral valve regurgitation.  5. The aortic valve was not well visualized. Aortic valve regurgitation is not visualized. No aortic stenosis is  present. Comparison(s): No prior Echocardiogram. FINDINGS  Left Ventricle: Left ventricular ejection fraction, by estimation, is 55 to 60%. The left ventricle has normal function. Left ventricular endocardial border not optimally defined to evaluate regional wall motion. The left  ventricular internal cavity size was normal in size. Suboptimal image quality limits for assessment of left ventricular hypertrophy. Left ventricular diastolic parameters are consistent with Grade I diastolic dysfunction (impaired relaxation). Right Ventricle: The right ventricule is not well visualized but grossly the systolic function appears normal. S' 15. The right ventricular size is not well visualized. Right vetricular wall thickness was not well visualized. Right ventricular systolic function was not well visualized. Left Atrium: Left atrial size was not well visualized. Right Atrium: Right atrial size was not well visualized. Pericardium: There is no evidence of pericardial effusion. Mitral Valve: The mitral valve is grossly normal. No evidence of mitral valve regurgitation. Tricuspid Valve: The tricuspid valve is normal in structure. Tricuspid valve regurgitation is not demonstrated. Aortic Valve: The aortic valve was not well visualized. Aortic valve regurgitation is not visualized. No aortic stenosis is present. Aortic valve mean gradient measures 3.0 mmHg. Aortic valve peak gradient measures 5.9 mmHg. Aortic valve area, by VTI measures 3.48 cm. Pulmonic Valve: The pulmonic valve was not well visualized. Aorta: The aortic root is normal in size and structure. IAS/Shunts: The atrial septum is grossly normal.  LEFT VENTRICLE PLAX 2D LVIDd:         4.20 cm   Diastology LVIDs:         2.80 cm   LV e' medial:    11.30 cm/s LV PW:         1.00 cm   LV E/e' medial:  4.9 LV IVS:        1.00 cm   LV e' lateral:   4.24 cm/s LVOT diam:     2.20 cm   LV E/e' lateral: 13.1 LV SV:         65 LV SV Index:   32 LVOT Area:     3.80 cm  RIGHT VENTRICLE RV S prime:     15.30 cm/s LEFT ATRIUM             Index LA Vol (A2C):   26.0 ml 12.73 ml/m LA Vol (A4C):   39.0 ml 19.10 ml/m LA Biplane Vol: 32.9 ml 16.11 ml/m  AORTIC VALVE AV Area (Vmax):    3.20 cm AV Area (Vmean):   3.23 cm AV Area (VTI):     3.48 cm AV Vmax:            121.00 cm/s AV Vmean:          80.800 cm/s AV VTI:            0.187 m AV Peak Grad:      5.9 mmHg AV Mean Grad:      3.0 mmHg LVOT Vmax:         102.00 cm/s LVOT Vmean:        68.700 cm/s LVOT VTI:          0.171 m LVOT/AV VTI ratio: 0.91  AORTA Ao Root diam: 2.90 cm MITRAL VALVE MV Area (PHT): 3.37 cm    SHUNTS MV Decel Time: 225 msec    Systemic VTI:  0.17 m MV E velocity: 55.50 cm/s  Systemic Diam: 2.20 cm MV A velocity: 82.30 cm/s MV E/A ratio:  0.67 Gwyndolyn Kaufman MD Electronically signed by Gwyndolyn Kaufman MD Signature Date/Time: 09/22/2022/9:04:20 AM  Final    EEG adult  Result Date: 09/08/2022 Lora Havens, MD     09/25/2022  9:00 AM Patient Name: ITAY AVELLINO MRN: [id number] Epilepsy Attending: Lora Havens Referring Physician/Provider: Mick Sell, PA-C Date: 09/25/2022 Duration: 22.12 mins Patient history: 61yo M s/p cardiac arrest. EEG to evaluate for seizure Level of alertness: comatose AEDs during EEG study: propofol Technical aspects: This EEG study was done with scalp electrodes positioned according to the 10-20 International system of electrode placement. Electrical activity was reviewed with band pass filter of 1-70Hz , sensitivity of 7 uV/mm, display speed of 8mm/sec with a 60Hz  notched filter applied as appropriate. EEG data were recorded continuously and digitally stored.  Video monitoring was available and reviewed as appropriate. Description: Patient was noted to have episodes of sudden eye opening with whole body jerking every 1-2 minutes lasting 2-10 seconds. Concomitant EEG showed generalized polyspikes consistent with myoclonic seizures.  In between seizures EEG showed generalized background suppression. Hyperventilation and photic stimulation were not performed.   ABNORMALITY - Myoclonic seizures, generalized - Background suppression, generalized IMPRESSION: Patient was noted to have myoclonic seizures every 1-2 minutes lasting 2-10 seconds as described above.  Additionally there was profound diffuse encephalopathy.  In the setting of cardiac arrest, this EEG pattern is suggestive of anoxic/hypoxic brain injury. Dr Carlis Abbott was notified Lora Havens   CT Head Wo Contrast  Result Date: 09/07/2022 CLINICAL DATA:  Found down at home after using fentanyl EXAM: CT HEAD WITHOUT CONTRAST TECHNIQUE: Contiguous axial images were obtained from the base of the skull through the vertex without intravenous contrast. RADIATION DOSE REDUCTION: This exam was performed according to the departmental dose-optimization program which includes automated exposure control, adjustment of the mA and/or kV according to patient size and/or use of iterative reconstruction technique. COMPARISON:  01/07/2017 CT head FINDINGS: Brain: No evidence of acute infarction, hemorrhage, mass, mass effect, or midline shift. No hydrocephalus or extra-axial fluid collection. Gray-white differentiation is preserved. Vascular: No hyperdense vessel. Skull: Normal. Negative for fracture or focal lesion. Sinuses/Orbits: Mucosal thickening and bubbly fluid throughout the paranasal sinuses, which may be related to intubation. The orbits are unremarkable. Other: The mastoid air cells are well aerated. IMPRESSION: No acute intracranial process. Electronically Signed   By: Merilyn Baba M.D.   On: 09/15/2022 03:14   DG Chest Portable 1 View  Result Date: 08/31/2022 CLINICAL DATA:  Cardiac arrest status post intubation EXAM: PORTABLE CHEST 1 VIEW COMPARISON:  Radiographs 01/07/2017 FINDINGS: Endotracheal tube tip in the mid intrathoracic trachea. Subdiaphragmatic enteric tube. Defibrillator pad. The lungs are clear. No pleural effusion or pneumothorax. Normal cardiomediastinal silhouette. No displaced rib fractures. IMPRESSION: 1. ETT in good position. 2. The lungs are clear. Electronically Signed   By: Placido Sou M.D.   On: 09/24/2022 03:05   DG Abdomen 1 View  Result Date: 09/15/2022 CLINICAL DATA:  OG  placement EXAM: ABDOMEN - 1 VIEW COMPARISON:  None Available. FINDINGS: Enteric tube tip and side port in the stomach. The bowel gas pattern is unremarkable. No radio-opaque calculi or other significant radiographic abnormality are seen. IMPRESSION: Enteric tube tip and side-port in the stomach. Electronically Signed   By: Placido Sou M.D.   On: 09/19/2022 03:03    Microbiology Recent Results (from the past 240 hour(s))  Blood culture (routine x 2)     Status: Abnormal   Collection Time: 09/16/2022  2:37 AM   Specimen: BLOOD  Result Value Ref Range Status   Specimen Description  BLOOD LEFT ANTECUBITAL  Final   Special Requests   Final    BOTTLES DRAWN AEROBIC AND ANAEROBIC Blood Culture adequate volume   Culture  Setup Time   Final    GRAM POSITIVE COCCI IN CHAINS IN BOTH AEROBIC AND ANAEROBIC BOTTLES CRITICAL RESULT CALLED TO, READ BACK BY AND VERIFIED WITH: PHARMD K.PIERCE AT 1811 ON 09/21/2022 BY T.SAAD.    Culture (A)  Final    STREPTOCOCCUS SALIVARIUS STREPTOCOCCUS MITIS/ORALIS STAPHYLOCOCCUS HOMINIS THE SIGNIFICANCE OF ISOLATING THIS ORGANISM FROM A SINGLE SET OF BLOOD CULTURES WHEN MULTIPLE SETS ARE DRAWN IS UNCERTAIN. PLEASE NOTIFY THE MICROBIOLOGY DEPARTMENT WITHIN ONE WEEK IF SPECIATION AND SENSITIVITIES ARE REQUIRED. Performed at [facility], [place] [address], [place], [place] [address]    Report Status 09/12/2022 FINAL  Final  Blood Culture ID Panel (Reflexed)     Status: Abnormal   Collection Time: 09/06/2022  2:37 AM  Result Value Ref Range Status   Enterococcus faecalis NOT DETECTED NOT DETECTED Final   Enterococcus Faecium NOT DETECTED NOT DETECTED Final   Listeria monocytogenes NOT DETECTED NOT DETECTED Final   Staphylococcus species DETECTED (A) NOT DETECTED Final    Comment: CRITICAL RESULT CALLED TO, READ BACK BY AND VERIFIED WITH: PHARMD K.PIERCE AT 1811 ON 09/27/2022 BY T.SAAD.    Staphylococcus aureus (BCID) NOT DETECTED NOT DETECTED Final    Staphylococcus epidermidis NOT DETECTED NOT DETECTED Final   Staphylococcus lugdunensis NOT DETECTED NOT DETECTED Final   Streptococcus species DETECTED (A) NOT DETECTED Final    Comment: Not Enterococcus species, Streptococcus agalactiae, Streptococcus pyogenes, or Streptococcus pneumoniae. CRITICAL RESULT CALLED TO, READ BACK BY AND VERIFIED WITH: PHARMD K.PIERCE AT 1811 ON 09/07/2022 BY T.SAAD.    Streptococcus agalactiae NOT DETECTED NOT DETECTED Final   Streptococcus pneumoniae NOT DETECTED NOT DETECTED Final   Streptococcus pyogenes NOT DETECTED NOT DETECTED Final   A.calcoaceticus-baumannii NOT DETECTED NOT DETECTED Final   Bacteroides fragilis NOT DETECTED NOT DETECTED Final   Enterobacterales NOT DETECTED NOT DETECTED Final   Enterobacter cloacae complex NOT DETECTED NOT DETECTED Final   Escherichia coli NOT DETECTED NOT DETECTED Final   Klebsiella aerogenes NOT DETECTED NOT DETECTED Final   Klebsiella oxytoca NOT DETECTED NOT DETECTED Final   Klebsiella pneumoniae NOT DETECTED NOT DETECTED Final   Proteus species NOT DETECTED NOT DETECTED Final   Salmonella species NOT DETECTED NOT DETECTED Final   Serratia marcescens NOT DETECTED NOT DETECTED Final   Haemophilus influenzae NOT DETECTED NOT DETECTED Final   Neisseria meningitidis NOT DETECTED NOT DETECTED Final   Pseudomonas aeruginosa NOT DETECTED NOT DETECTED Final   Stenotrophomonas maltophilia NOT DETECTED NOT DETECTED Final   Candida albicans NOT DETECTED NOT DETECTED Final   Candida auris NOT DETECTED NOT DETECTED Final   Candida glabrata NOT DETECTED NOT DETECTED Final   Candida krusei NOT DETECTED NOT DETECTED Final   Candida parapsilosis NOT DETECTED NOT DETECTED Final   Candida tropicalis NOT DETECTED NOT DETECTED Final   Cryptococcus neoformans/gattii NOT DETECTED NOT DETECTED Final    Comment: Performed at [facility], [address], [place], [place] [address]  Resp Panel by RT-PCR (Flu A&B, Covid)  Anterior Nasal Swab     Status: None   Collection Time: 09/08/2022  2:45 AM   Specimen: Anterior Nasal Swab  Result Value Ref Range Status   SARS Coronavirus 2 by RT PCR NEGATIVE NEGATIVE Final    Comment: (NOTE) SARS-CoV-2 target nucleic acids are NOT DETECTED.  The SARS-CoV-2 RNA is  generally detectable in upper respiratory specimens during the acute phase of infection. The lowest concentration of SARS-CoV-2 viral copies this assay can detect is 138 copies/mL. A negative result does not preclude SARS-Cov-2 infection and should not be used as the sole basis for treatment or other patient management decisions. A negative result may occur with  improper specimen collection/handling, submission of specimen other than nasopharyngeal swab, presence of viral mutation(s) within the areas targeted by this assay, and inadequate number of viral copies(<138 copies/mL). A negative result must be combined with clinical observations, patient history, and epidemiological information. The expected result is Negative.  Fact Sheet for Patients:  EntrepreneurPulse.com.au  Fact Sheet for Healthcare Providers:  IncredibleEmployment.be  This test is no t yet approved or cleared by the [place] FDA and  has been authorized for detection and/or diagnosis of SARS-CoV-2 by FDA under an Emergency Use Authorization (EUA). This EUA will remain  in effect (meaning this test can be used) for the duration of the COVID-19 declaration under Section 564(b)(1) of the Act, 21 U.S.C.section 360bbb-3(b)(1), unless the authorization is terminated  or revoked sooner.       Influenza A by PCR NEGATIVE NEGATIVE Final   Influenza B by PCR NEGATIVE NEGATIVE Final    Comment: (NOTE) The Xpert Xpress SARS-CoV-2/FLU/RSV plus assay is intended as an aid in the diagnosis of influenza from Nasopharyngeal swab specimens and should not be used as a sole basis for treatment. Nasal washings  and aspirates are unacceptable for Xpert Xpress SARS-CoV-2/FLU/RSV testing.  Fact Sheet for Patients: EntrepreneurPulse.com.au  Fact Sheet for Healthcare Providers: IncredibleEmployment.be  This test is not yet approved or cleared by the [place] FDA and has been authorized for detection and/or diagnosis of SARS-CoV-2 by FDA under an Emergency Use Authorization (EUA). This EUA will remain in effect (meaning this test can be used) for the duration of the COVID-19 declaration under Section 564(b)(1) of the Act, 21 U.S.C. section 360bbb-3(b)(1), unless the authorization is terminated or revoked.  Performed at [facility], [place] [address], [place], [place] [address]   Blood culture (routine x 2)     Status: None   Collection Time: 09/15/2022  3:21 AM   Specimen: BLOOD  Result Value Ref Range Status   Specimen Description BLOOD RIGHT ANTECUBITAL  Final   Special Requests   Final    BOTTLES DRAWN AEROBIC AND ANAEROBIC Blood Culture results may not be optimal due to an excessive volume of blood received in culture bottles   Culture   Final    NO GROWTH 5 DAYS Performed at [facility], [place] [address] [place], [place] [address]    Report Status Oct 05, 2022 FINAL  Final  MRSA Next Gen by PCR, Nasal     Status: None   Collection Time: 09/26/2022  6:00 AM   Specimen: Nasal Mucosa; Nasal Swab  Result Value Ref Range Status   MRSA by PCR Next Gen NOT DETECTED NOT DETECTED Final    Comment: (NOTE) The GeneXpert MRSA Assay (FDA approved for NASAL specimens only), is one component of a comprehensive MRSA colonization surveillance program. It is not intended to diagnose MRSA infection nor to guide or monitor treatment for MRSA infections. Test performance is not FDA approved in patients less than 74 years old. Performed at [facility], [place] [address] [place], [place] [address]   Culture, blood (Routine X 2) w Reflex to ID Panel      Status: None (Preliminary result)  Collection Time: 09/10/22  9:01 AM   Specimen: BLOOD RIGHT HAND  Result Value Ref Range Status   Specimen Description BLOOD RIGHT HAND  Final   Special Requests   Final    BOTTLES DRAWN AEROBIC AND ANAEROBIC Blood Culture results may not be optimal due to an inadequate volume of blood received in culture bottles   Culture   Final    NO GROWTH 4 DAYS Performed at [facility], [place] [address], [place], [place] [address]    Report Status PENDING  Incomplete  Culture, blood (Routine X 2) w Reflex to ID Panel     Status: None (Preliminary result)   Collection Time: 09/10/22  9:06 AM   Specimen: BLOOD LEFT HAND  Result Value Ref Range Status   Specimen Description BLOOD LEFT HAND  Final   Special Requests   Final    BOTTLES DRAWN AEROBIC AND ANAEROBIC Blood Culture adequate volume   Culture   Final    NO GROWTH 4 DAYS Performed at [facility], [place] [address], [place], [place] [address]    Report Status PENDING  Incomplete  Culture, Respiratory w Gram Stain     Status: None   Collection Time: 09/11/22  3:28 AM   Specimen: Tracheal Aspirate; Respiratory  Result Value Ref Range Status   Specimen Description TRACHEAL ASPIRATE  Final   Special Requests NONE  Final   Gram Stain   Final    NO WBC SEEN FEW GRAM POSITIVE COCCI IN PAIRS AND CHAINS FEW GRAM NEGATIVE RODS RARE GRAM POSITIVE COCCI IN CLUSTERS    Culture   Final    FEW Consistent with normal respiratory flora. Performed at [facility], [place] [address] [place], [place] [address]    Report Status 09/05/2022 FINAL  Final  Culture, blood (Routine X 2) w Reflex to ID Panel     Status: Abnormal   Collection Time: 09/11/22  7:39 PM   Specimen: BLOOD LEFT ARM  Result Value Ref Range Status   Specimen Description BLOOD LEFT ARM  Final   Special Requests   Final    BOTTLES DRAWN AEROBIC AND ANAEROBIC Blood Culture adequate volume   Culture  Setup Time   Final    GRAM POSITIVE  COCCI IN CLUSTERS IN BOTH AEROBIC AND ANAEROBIC BOTTLES CRITICAL RESULT CALLED TO, READ BACK BY AND VERIFIED WITH: PHARMD CAREN AMEND ON 09/12/22 @ 1838 BY DRT     Culture (A)  Final    STAPHYLOCOCCUS EPIDERMIDIS THE SIGNIFICANCE OF ISOLATING THIS ORGANISM FROM A SINGLE SET OF BLOOD CULTURES WHEN MULTIPLE SETS ARE DRAWN IS UNCERTAIN. PLEASE NOTIFY THE MICROBIOLOGY DEPARTMENT WITHIN ONE WEEK IF SPECIATION AND SENSITIVITIES ARE REQUIRED. Performed at [facility], [place] [address], [place],  [address]    Report Status 09/19/2022 FINAL  Final  Culture, blood (Routine X 2) w Reflex to ID Panel     Status: None (Preliminary result)   Collection Time: 09/11/22  7:39 PM   Specimen: BLOOD LEFT ARM  Result Value Ref Range Status   Specimen Description BLOOD LEFT ARM  Final   Special Requests   Final    BOTTLES DRAWN AEROBIC AND ANAEROBIC Blood Culture results may not be optimal due to an inadequate volume of blood received in culture bottles   Culture   Final    NO GROWTH 3 DAYS Performed at [facility], [place] [address] [place],  [address]    Report Status  PENDING  Incomplete  Urine Culture     Status: None   Collection Time: 09/11/22  8:45 PM   Specimen: Urine, Catheterized  Result Value Ref Range Status   Specimen Description URINE, CATHETERIZED  Final   Special Requests NONE  Final   Culture   Final    NO GROWTH Performed at [facility], [address], [place], [place] [address]    Report Status 09/12/2022 FINAL  Final  SARS Coronavirus 2 by RT PCR (hospital order, performed in [facility] hospital lab) *cepheid single result test* Anterior Nasal Swab     Status: None   Collection Time: 09/12/22  8:27 PM   Specimen: Anterior Nasal Swab  Result Value Ref Range Status   SARS Coronavirus 2 by RT PCR NEGATIVE NEGATIVE Final    Comment: (NOTE) SARS-CoV-2 target nucleic acids are NOT DETECTED.  The SARS-CoV-2 RNA is generally detectable in upper and  lower respiratory specimens during the acute phase of infection. The lowest concentration of SARS-CoV-2 viral copies this assay can detect is 250 copies / mL. A negative result does not preclude SARS-CoV-2 infection and should not be used as the sole basis for treatment or other patient management decisions.  A negative result may occur with improper specimen collection / handling, submission of specimen other than nasopharyngeal swab, presence of viral mutation(s) within the areas targeted by this assay, and inadequate number of viral copies (<250 copies / mL). A negative result must be combined with clinical observations, patient history, and epidemiological information.  Fact Sheet for Patients:   RoadLapTop.co.za  Fact Sheet for Healthcare Providers: http://kim-miller.com/  This test is not yet approved or  cleared by the [place] FDA and has been authorized for detection and/or diagnosis of SARS-CoV-2 by FDA under an Emergency Use Authorization (EUA).  This EUA will remain in effect (meaning this test can be used) for the duration of the COVID-19 declaration under Section 564(b)(1) of the Act, 21 U.S.C. section 360bbb-3(b)(1), unless the authorization is terminated or revoked sooner.  Performed at [facility], [address] [place], [place] [address]     Lab Basic Metabolic Panel: Recent Labs  Lab 09/12/22 1420 09/12/22 2005 09/07/2022 0150 09/10/2022 0437 09/11/2022 0801 09/16/2022 1433  NA 141 143 142 142 145 141  K 4.7 4.6 4.3 4.3 4.4 4.2  CL 111 111 108  --  107 106  CO2 25 26 26   --  27 26  GLUCOSE 147* 109* 112*  --  121* 130*  BUN 29* 28* 27*  --  26* 22*  CREATININE 0.67 0.65 0.58*  --  0.57* 0.62  CALCIUM 8.2* 8.4* 8.3*  --  8.3* 8.2*  MG 2.3 2.2 2.1  --  2.1 2.0  PHOS 4.4 3.6 3.5  --  3.8 3.7   Liver Function Tests: Recent Labs  Lab 09/12/22 1420 09/12/22 2005 09/24/2022 0150 09/06/2022 0801  09/24/2022 1433  AST 89* 84* 82* 71* 70*  ALT 57* 51* 48* 47* 43  ALKPHOS 38 33* 37* 37* 34*  BILITOT 0.5 0.3 0.6 0.3 0.4  PROT 5.8* 5.6* 5.6* 5.5* 5.6*  ALBUMIN 2.9* 2.8* 2.7* 2.7* 2.6*   No results for input(s): "LIPASE", "AMYLASE" in the last 168 hours. Recent Labs  Lab September 20, 2022 0706  AMMONIA 67*   CBC: Recent Labs  Lab 09/12/22 1420 09/12/22 2003 09/21/2022 0150 09/18/2022 0437 09/18/2022 0801 08/31/2022 1433  WBC 16.1* 16.6* 15.0*  --  13.8* 12.5*  NEUTROABS 14.5*  13.6* 12.1*  --  10.8* 9.8*  HGB 13.4 13.0 13.1 12.6* 13.2 13.0  HCT 39.7 40.0 39.8 37.0* 39.3 39.2  MCV 89.2 90.5 90.9  --  90.6 90.3  PLT 202 209 207  --  204 200   Cardiac Enzymes: Recent Labs  Lab 09/12/22 1420 09/12/22 2005 09/04/2022 0150 09/07/2022 0801 09/17/2022 1433  CKTOTAL 408* 341 294 233 181  CKMB 4.1 3.7 5.1* 4.3 3.5   Sepsis Labs: Recent Labs  Lab 09/26/2022 0232 09/18/2022 0237 08/31/2022 0526 09/21/2022 0706 09/10/22 0745 09/10/22 0901 09/11/22 0550 09/12/22 2003 09/12/2022 0150 09/28/2022 0801 09/02/2022 1433  WBC  --    < > 15.3*  --    < >  --    < > 16.6* 15.0* 13.8* 12.5*  LATICACIDVEN 7.0*  --  1.8 4.1*  --  1.5  --   --   --   --   --    < > = values in this interval not displayed.    Procedures/Operations     [employer] 10-06-22, 3:32 PM

## 2022-09-30 NOTE — Progress Notes (Signed)
1352 Spoke to ME. Can remove ET tube. He will be a delayed overdose case. Once he passes please call ME  Clare Charon la torra [phone]  1416 Extubated

## 2022-09-30 NOTE — Progress Notes (Addendum)
NAME:  Darren Lane, MRN:  [id number], DOB:  1961/07/28, LOS: 5 ADMISSION DATE:  09/19/2022, CONSULTATION DATE:  9/10 REFERRING MD:  Dr. Nicanor Alcon, CHIEF COMPLAINT:  post arrest   History of Present Illness:  Patient is a 61 year old male with pertinent PMH of asthma and polysubstance abuse presents to [facility] ED on 9/10 post arrest.  Found down 9/10 by a girlfriend unresponsive and pulseless. Reports that he was using fentanyl earlier. Unwitnessed arrest. EMS called and found patient pulseless and asystole. Powder appreciated on floor on scene.  CPR started and [facility] airway placed.  Epi given.  ROSC about 20 minutes.  Patient transferred to [facility] ED.  Upon arrival to [facility] ED on 9/10 patient with [facility] airway.  UDS was obtained prior to giving RSI medications for ETT exchange.  Patient initial BP 189/108, HR 107.  Breathing spontaneously but diminished wheezing present.  GCS 1.  CXR appears clear with ETT in good position.  CT head with no acute abnormality.  EKG with no elevated ST.  LA 7, K5.2, glucose 229, AST 148, ALT 121.  Salicylate, acetaminophen, ethanol WNL.  UDS positive for cocaine and opiates.  WBC 14.1.  Troponin 31.  ABG 7.10, 81, 510, 26.  PCCM consulted for ICU admission.  Patient was transitioned to DNR status 9/13 and to comfort measures 9/14.  Pertinent  Medical History  polysubstance abuse Asthma Retinal detachment of right eye  Significant Hospital Events: Including procedures, antibiotic start and stop dates in addition to other pertinent events   9/10: post arrest; unknown down time; found with fentanyl and cocaine in UDS; EMS called found pulseless in asystole; king airway placed and performed CPR for about 20 minutes until rosc. Admitted to [facility]. 9/11 EEG with myoclonic seizures and profound encephalopathy suggestive of anoxic injury. 9/11 Versed weaned off but Propofol remains at 60, when weaned myoclonic and decerebrate posturing noted. 9/12 MRI with diffuse restricted  diffusion involving the cortex and bilateral basal ganglia as well as the thalami raising concern for hypoxic/ischemic injury there was mild cerebral edema without mass effect.  CT chest showing patchy bibasilar airspace disease.  Mild hepatic steatosis with focal fatty sparing.  EEG suggesting profound/diffuse encephalopathy but no seizure.  Goals of care discussion completed Family decided on DNR 9/14 Patient transitioned to comfort measures ~1700, went to OR for DCD organ donation but did not pass in allotted amount of time; transferred back to ICU. 9/15 Comfort care.  Interim History / Subjective:  No significant events overnight Patient went to OR for DCD organ donation but did not pass Transferred back to ICU Remains comfort care  Objective   Blood pressure 111/68, pulse (!) 128, temperature (!) 100.9 F (38.3 C), resp. rate (!) 50, height 5\' 9"  (1.753 m), weight 80.4 kg, SpO2 (!) 60 %.    Vent Mode: PRVC FiO2 (%):  [40 %] 40 % Set Rate:  [24 bmp] 24 bmp Vt Set:  [560 mL] 560 mL PEEP:  [5 cmH20] 5 cmH20 Plateau Pressure:  [19 cmH20-22 cmH20] 19 cmH20   Intake/Output Summary (Last 24 hours) at 09/30/2022 0739 Last data filed at 2022-09-30 0600 Gross per 24 hour  Intake 1573.33 ml  Output 1525 ml  Net 48.33 ml    Filed Weights   09/12/22 0000 09/12/22 0428 09/25/2022 0435  Weight: 79.4 kg 79.4 kg 80.4 kg   Physical Examination: General: Acutely ill-appearing middle-aged man in NAD. Intubated, sedated. HEENT: [place]/AT, anicteric sclera, R pupil 71mm/round/reactive, L pupil 56mm/round/reactive, moist  mucous membranes. ETT/OGT in place, not connected to vent (ME case). Neuro: Sedated. Does not respond to verbal, tactile or noxious stimuli. Not following commands. No spontaneous movement of extremities noted. CV: Tachycardic to 120s, no m/g/r. PULM: Tachypneic, unlabored breathing on RA. ETT remains in place, not connected to vent. Lung fields CTAB. GI: Soft, nontender, nondistended.  Hypoactive bowel sounds. Extremities: No LE edema noted. Skin: Warm/dry, no rashes.  Assessment & Plan:   Status post cardiac Arrest, PEA secondary to drug overdose Lactic acidosis Acute respiratory failure with hypercarbia and hypoxia on MV Asthma exacerbation and possible aspiration pneumonia Strep mitis bacteremia, Staph hominis.  Likely contamination Anoxic brain injury/anoxic encephalopathy Polysubstance abuse, Status myoclonus - MRI confirming anoxic injury with cerebral edema, family aware Prediabetes with hyperglycemia in setting of steroid Shock liver Hypertriglyceridemia in the setting of propofol infusion  - Patient made DNR code status on 9/13 per family wishes - Transitioned to Comfort Care 9/14 per family wishes and in anticipation of DCD organ donation; patient did not pass in time frame to allow donation - Transferred back to ICU with ongoing Comfort Care - Continue PAD (Morphine, Versed) titrated for comfort, Haldol PRN for agitation - Continue Robinul, APAP - Unable to transfer to 6N/Palliative floor due to ETT in place (ME case) - In-hospital death expected  Best Practice (right click and "Reselect all SmartList Selections" daily)   Diet/type: NPO DVT prophylaxis: prophylactic heparin  GI prophylaxis: PPI Lines: N/A Foley:  Yes, and it is still needed Code Status:  DNR  Signature:   Tim Lair, PA-C [facility] 09/28/2022 8:01 AM  Please see [email or web address] for pager details.  From 7A-7P if no response, please call [phone] After hours, please call ELink [phone]

## 2022-09-30 DEATH — deceased
